# Patient Record
Sex: Male | Born: 1965
Health system: Southern US, Community
[De-identification: ages and names within clinical notes are randomized; demographics above are authoritative.]

## PROBLEM LIST (undated history)

## (undated) DIAGNOSIS — I10 Essential (primary) hypertension: Secondary | ICD-10-CM

## (undated) DIAGNOSIS — M109 Gout, unspecified: Secondary | ICD-10-CM

---

## 1998-07-06 ENCOUNTER — Emergency Department (HOSPITAL_COMMUNITY): Admission: EM | Admit: 1998-07-06 | Discharge: 1998-07-06 | Payer: Self-pay | Admitting: Emergency Medicine

## 1998-09-29 ENCOUNTER — Emergency Department (HOSPITAL_COMMUNITY): Admission: EM | Admit: 1998-09-29 | Discharge: 1998-09-29 | Payer: Self-pay | Admitting: *Deleted

## 1998-10-16 ENCOUNTER — Emergency Department (HOSPITAL_COMMUNITY): Admission: EM | Admit: 1998-10-16 | Discharge: 1998-10-16 | Payer: Self-pay | Admitting: Emergency Medicine

## 2005-02-03 ENCOUNTER — Emergency Department (HOSPITAL_COMMUNITY): Admission: EM | Admit: 2005-02-03 | Discharge: 2005-02-03 | Payer: Self-pay | Admitting: Emergency Medicine

## 2005-02-03 ENCOUNTER — Inpatient Hospital Stay (HOSPITAL_COMMUNITY): Admission: RE | Admit: 2005-02-03 | Discharge: 2005-02-07 | Payer: Self-pay | Admitting: Psychiatry

## 2005-02-03 ENCOUNTER — Ambulatory Visit: Payer: Self-pay | Admitting: Psychiatry

## 2005-02-10 ENCOUNTER — Other Ambulatory Visit (HOSPITAL_COMMUNITY): Admission: RE | Admit: 2005-02-10 | Discharge: 2005-03-05 | Payer: Self-pay | Admitting: Psychiatry

## 2006-11-16 ENCOUNTER — Emergency Department (HOSPITAL_COMMUNITY): Admission: EM | Admit: 2006-11-16 | Discharge: 2006-11-16 | Payer: Self-pay | Admitting: Family Medicine

## 2007-03-16 ENCOUNTER — Emergency Department (HOSPITAL_COMMUNITY): Admission: EM | Admit: 2007-03-16 | Discharge: 2007-03-16 | Payer: Self-pay | Admitting: Emergency Medicine

## 2007-06-15 ENCOUNTER — Emergency Department (HOSPITAL_COMMUNITY): Admission: EM | Admit: 2007-06-15 | Discharge: 2007-06-15 | Payer: Self-pay | Admitting: Emergency Medicine

## 2007-12-03 ENCOUNTER — Emergency Department (HOSPITAL_COMMUNITY): Admission: EM | Admit: 2007-12-03 | Discharge: 2007-12-03 | Payer: Self-pay | Admitting: Emergency Medicine

## 2010-04-12 ENCOUNTER — Emergency Department (HOSPITAL_COMMUNITY): Admission: EM | Admit: 2010-04-12 | Discharge: 2010-04-12 | Payer: Self-pay | Admitting: Emergency Medicine

## 2010-10-10 LAB — POCT CARDIAC MARKERS
CKMB, poc: <1 ng/mL — ABNORMAL LOW (ref 1.0–8.0)
Troponin i, poc: <0.05 ng/mL (ref 0.00–0.09)

## 2010-10-10 LAB — BASIC METABOLIC PANEL
BUN: 8 mg/dL (ref 6–23)
Chloride: 107 mEq/L (ref 96–112)
GFR calc Af Amer: >60 mL/min (ref >60)
GFR calc non Af Amer: >60 mL/min (ref >60)
Potassium: 3.8 mEq/L (ref 3.5–5.1)
Sodium: 137 mEq/L (ref 135–145)

## 2010-10-10 LAB — CBC
HCT: 44.2 % (ref 39.0–52.0)
RBC: 4.96 MIL/uL (ref 4.22–5.81)
RDW: 13.7 % (ref 11.5–15.5)
WBC: 4 10*3/uL (ref 4.0–10.5)

## 2010-10-10 LAB — DIFFERENTIAL
Basophils Absolute: 0 10*3/uL (ref 0.0–0.1)
Lymphocytes Relative: 40 % (ref 12–46)
Lymphs Abs: 1.6 10*3/uL (ref 0.7–4.0)
Monocytes Absolute: 0.4 10*3/uL (ref 0.1–1.0)
Neutro Abs: 1.7 10*3/uL (ref 1.7–7.7)

## 2010-10-10 LAB — GLUCOSE, CAPILLARY: Glucose-Capillary: 149 mg/dL — ABNORMAL HIGH (ref 70–99)

## 2010-11-27 ENCOUNTER — Inpatient Hospital Stay (INDEPENDENT_AMBULATORY_CARE_PROVIDER_SITE_OTHER)
Admission: RE | Admit: 2010-11-27 | Discharge: 2010-11-27 | Disposition: A | Discharge status: Home / Self Care | Payer: Self-pay | Source: Ambulatory Visit | Attending: Family Medicine | Admitting: Family Medicine

## 2010-11-27 DIAGNOSIS — J069 Acute upper respiratory infection, unspecified: Secondary | ICD-10-CM

## 2010-12-13 NOTE — Discharge Summary (Signed)
NAME:  Bill Ferrell, Bill Ferrell NO.:  [id number]   MEDICAL RECORD NO.:  [id number]          PATIENT TYPE:  IPS   LOCATION:  0303                          FACILITY:  [facility]   PHYSICIAN:  Jeanice Lim, M.D. DATE OF BIRTH:  08-Jul-1966   DATE OF ADMISSION:  02/03/2005  DATE OF DISCHARGE:  02/07/2005                                 DISCHARGE SUMMARY   IDENTIFYING DATA:  This is a 44 year old African-American married male  admitted at the presenting emergency department to get off drugs and  alcohol.  Escalating use for the last two years.  He has been drinking up to  20 beers a day, staying intoxicated all the time.  Using cocaine for the  past eight years, escalating use for the past two years, using 2-3 times per  week.  Limited support system.  Initially had a binge pattern and then began  using daily.   PAST PSYCHIATRIC HISTORY:  This is the first inpatient psychiatric  treatment, first inpatient detoxification.  He has been seen in [facility] as an  outpatient.  Denied a history of mood swings or other mood symptoms.  The  longest period of sobriety was approximately 30 days and this has not been  recently.   MEDICATIONS:  Hydrochlorothiazide.   NEUROLOGIC AND PHYSICAL EXAMINATION:  Essentially within normal limits.   ROUTINE ADMISSION LABS:  Within normal limits.   MENTAL STATUS EXAM:  Fully alert, pleasant, cooperative.  Felt that he was  somewhat useless, hopeless, helpless and apparently recognized the severity  of his addiction.  No suicidal thoughts, however.  No psychotic symptoms.  Cognition was intact.  Judgment and insight were somewhat impaired with poor  impulse control.   ADMITTING DIAGNOSES:  Axis I.  1.  Rule out substance induced mood disorder.  1.  Alcohol dependence.  2.  Cocaine dependence.  Axis II.  Deferred.  Axis III.  Hypertension.  Axis IV.  Moderate, marital friction, occupational issues.  Axis V.  30/60.   HOSPITAL COURSE:  The patient was  admitted, ordered routine p.r.n.  medications, placed on safety checks, placed on detoxification protocol for  safe withdrawal with close medical monitoring.  The patient was detoxified  without complications.  Support system mobilized, family contacted and were  involved in aftercare planning including substance abuse treatment and  relapse prevention plan.  The patient was discharged in improved condition  with no acute withdrawal symptoms and no dangerous ideations, mood stable.  Improved insight.  Given medication education and discharged on Symmetrel  100 mg twice a day, trazodone 50 mg 1-1/2 at 8 p.m. and Zoloft 50 mg in the  morning.  Follow up with [facility] on 07/17 at 10:30 and possibly [facility] for an  assessment.   DISCHARGE DIAGNOSES:  Axis I.  1.  Rule out substance induced mood disorder.  1.  Alcohol dependence.  2.  Cocaine dependence.  Axis II.  Deferred.  Axis III.  Hypertension.  Axis IV.  Moderate, marital friction, occupational issues.  Axis V.  55      James E.  Kathrynn Running, M.D.  Electronically Signed     JEM/MEDQ  D:  03/20/2005  T:  03/20/2005  Job:  [id number]
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
## 2013-11-27 ENCOUNTER — Emergency Department (INDEPENDENT_AMBULATORY_CARE_PROVIDER_SITE_OTHER)
Admission: EM | Admit: 2013-11-27 | Discharge: 2013-11-27 | Disposition: A | Discharge status: Home / Self Care | Payer: Self-pay | Source: Home / Self Care | Attending: Family Medicine | Admitting: Family Medicine

## 2013-11-27 ENCOUNTER — Encounter (HOSPITAL_COMMUNITY): Payer: Self-pay | Admitting: Emergency Medicine

## 2013-11-27 DIAGNOSIS — I10 Essential (primary) hypertension: Secondary | ICD-10-CM

## 2013-11-27 DIAGNOSIS — M109 Gout, unspecified: Secondary | ICD-10-CM

## 2013-11-27 HISTORY — DX: Gout, unspecified: M10.9

## 2013-11-27 HISTORY — DX: Essential (primary) hypertension: I10

## 2013-11-27 MED ORDER — COLCHICINE 0.6 MG PO TABS: ORAL_TABLET | ORAL | Status: DC

## 2013-11-27 NOTE — ED Provider Notes (Signed)
CSN: [id number]     Arrival date & time 11/27/13  1333 History   First MD Initiated Contact with Patient 11/27/13 1418     Chief Complaint  Patient presents with  . Joint Swelling   (Consider location/radiation/quality/duration/timing/severity/associated sxs/prior Treatment) HPI Comments: States he has known hx of gout for which he is not currently taking medication. States he has been experiencing a gout flare at his left ankle over past 6 days. Some relief of discomfort with the use of aspirin. No recent injury. No fever. Mild pain, redness and swelling at left medial ankle.   The history is provided by the patient.    Past Medical History  Diagnosis Date  . Hypertension   . Gout    History reviewed. No pertinent past surgical history. History reviewed. No pertinent family history. History  Substance Use Topics  . Smoking status: Current Every Day Smoker  . Smokeless tobacco: Not on file  . Alcohol Use: Yes    Review of Systems  All other systems reviewed and are negative.   Allergies  Review of patient's allergies indicates no known allergies.  Home Medications   Prior to Admission medications   Not on File   BP 153/103  Pulse 66  Temp(Src) 98.5 F (36.9 C) (Oral)  Resp 16  SpO2 98% Physical Exam  Nursing note and vitals reviewed. Constitutional: He is oriented to person, place, and time. He appears well-developed and well-nourished. No distress.  HENT:  Head: Normocephalic and atraumatic.  Eyes: Conjunctivae are normal.  Cardiovascular: Normal rate.   Pulmonary/Chest: Effort normal.  Musculoskeletal: Normal range of motion.       Feet:  Outlined area is area of tenderness with mild erythema and STS. No induration or lymphangitis  Neurological: He is alert and oriented to person, place, and time.  Skin: Skin is warm and dry. No rash noted. There is erythema.  Psychiatric: He has a normal mood and affect. His behavior is normal.    ED Course  Procedures  (including critical care time) Labs Review Labs Reviewed - No data to display  Imaging Review No results found.   MDM   1. HTN (hypertension)   2. Gout attack    Advised patient that I strongly encourage him to seek out a primary care doctor for management of his HTN to try to avoid end organ damage. Provided information regarding [facility] and [facility]. Colchicine as prescribed for gout flare.    Jess BartersJennifer Lee GeorgetownPresson, GeorgiaPA 11/27/13 1450

## 2013-11-27 NOTE — Discharge Instructions (Signed)
DASH Diet The DASH diet stands for "Dietary Approaches to Stop Hypertension." It is a healthy eating plan that has been shown to reduce high blood pressure (hypertension) in as little as 14 days, while also possibly providing other significant health benefits. These other health benefits include reducing the risk of breast cancer after menopause and reducing the risk of type 2 diabetes, heart disease, colon cancer, and stroke. Health benefits also include weight loss and slowing kidney failure in patients with chronic kidney disease.  DIET GUIDELINES  Limit salt (sodium). Your diet should contain less than 1500 mg of sodium daily.  Limit refined or processed carbohydrates. Your diet should include mostly whole grains. Desserts and added sugars should be used sparingly.  Include small amounts of heart-healthy fats. These types of fats include nuts, oils, and tub margarine. Limit saturated and trans fats. These fats have been shown to be harmful in the body. CHOOSING FOODS  The following food groups are based on a 2000 calorie diet. See your Registered Dietitian for individual calorie needs. Grains and Grain Products (6 to 8 servings daily)  Eat More Often: Whole-wheat bread, brown rice, whole-grain or wheat pasta, quinoa, popcorn without added fat or salt (air popped).  Eat Less Often: White bread, white pasta, white rice, cornbread. Vegetables (4 to 5 servings daily)  Eat More Often: Fresh, frozen, and canned vegetables. Vegetables may be raw, steamed, roasted, or grilled with a minimal amount of fat.  Eat Less Often/Avoid: Creamed or fried vegetables. Vegetables in a cheese sauce. Fruit (4 to 5 servings daily)  Eat More Often: All fresh, canned (in natural juice), or frozen fruits. Dried fruits without added sugar. One hundred percent fruit juice ( cup [237 mL] daily).  Eat Less Often: Dried fruits with added sugar. Canned fruit in light or heavy syrup. [employer], Fish, and Poultry (2  servings or less daily. One serving is 3 to 4 oz [85-114 g]).  Eat More Often: Ninety percent or leaner ground beef, tenderloin, sirloin. Round cuts of beef, chicken breast, [place] breast. All fish. Grill, bake, or broil your meat. Nothing should be fried.  Eat Less Often/Avoid: Fatty cuts of meat, [place], or chicken leg, thigh, or wing. Fried cuts of meat or fish. Dairy (2 to 3 servings)  Eat More Often: Low-fat or fat-free milk, low-fat plain or light yogurt, reduced-fat or part-skim cheese.  Eat Less Often/Avoid: Milk (whole, 2%).Whole milk yogurt. Full-fat cheeses. Nuts, Seeds, and Legumes (4 to 5 servings per week)  Eat More Often: All without added salt.  Eat Less Often/Avoid: Salted nuts and seeds, canned beans with added salt. Fats and Sweets (limited)  Eat More Often: Vegetable oils, tub margarines without trans fats, sugar-free gelatin. Mayonnaise and salad dressings.  Eat Less Often/Avoid: Coconut oils, palm oils, butter, stick margarine, cream, half and half, cookies, candy, pie. FOR MORE INFORMATION The Dash Diet Eating Plan: www.dashdiet.org Document Released: 07/03/2011 Document Revised: 10/06/2011 Document Reviewed: 07/03/2011 [facility] Patient Information 2014 [place], [place].  Hypertension As your heart beats, it forces blood through your arteries. This force is your blood pressure. If the pressure is too high, it is called hypertension (HTN) or high blood pressure. HTN is dangerous because you may have it and not know it. High blood pressure may mean that your heart has to work harder to pump blood. Your arteries may be narrow or stiff. The extra work puts you at risk for heart disease, stroke, and other problems.  Blood pressure consists of two numbers,  a higher number over a lower, 110/72, for example. It is stated as "110 over 72." The ideal is below 120 for the top number (systolic) and under 80 for the bottom (diastolic). Write down your blood pressure today. You  should pay close attention to your blood pressure if you have certain conditions such as:  Heart failure.  Prior heart attack.  Diabetes  Chronic kidney disease.  Prior stroke.  Multiple risk factors for heart disease. To see if you have HTN, your blood pressure should be measured while you are seated with your arm held at the level of the heart. It should be measured at least twice. A one-time elevated blood pressure reading (especially in the Emergency Department) does not mean that you need treatment. There may be conditions in which the blood pressure is different between your right and left arms. It is important to see your caregiver soon for a recheck. Most people have essential hypertension which means that there is not a specific cause. This type of high blood pressure may be lowered by changing lifestyle factors such as:  Stress.  Smoking.  Lack of exercise.  Excessive weight.  Drug/tobacco/alcohol use.  Eating less salt. Most people do not have symptoms from high blood pressure until it has caused damage to the body. Effective treatment can often prevent, delay or reduce that damage. TREATMENT  When a cause has been identified, treatment for high blood pressure is directed at the cause. There are a large number of medications to treat HTN. These fall into several categories, and your caregiver will help you select the medicines that are best for you. Medications may have side effects. You should review side effects with your caregiver. If your blood pressure stays high after you have made lifestyle changes or started on medicines,   Your medication(s) may need to be changed.  Other problems may need to be addressed.  Be certain you understand your prescriptions, and know how and when to take your medicine.  Be sure to follow up with your caregiver within the time frame advised (usually within two weeks) to have your blood pressure rechecked and to review your  medications.  If you are taking more than one medicine to lower your blood pressure, make sure you know how and at what times they should be taken. Taking two medicines at the same time can result in blood pressure that is too low. SEEK IMMEDIATE MEDICAL CARE IF:  You develop a severe headache, blurred or changing vision, or confusion.  You have unusual weakness or numbness, or a faint feeling.  You have severe chest or abdominal pain, vomiting, or breathing problems. MAKE SURE YOU:   Understand these instructions.  Will watch your condition.  Will get help right away if you are not doing well or get worse. Document Released: 07/14/2005 Document Revised: 10/06/2011 Document Reviewed: 03/03/2008 [facility] Patient Information 2014 [place], [place].  Gout Gout is an inflammatory arthritis caused by a buildup of uric acid crystals in the joints. Uric acid is a chemical that is normally present in the blood. When the level of uric acid in the blood is too high it can form crystals that deposit in your joints and tissues. This causes joint redness, soreness, and swelling (inflammation). Repeat attacks are common. Over time, uric acid crystals can form into masses (tophi) near a joint, destroying bone and causing disfigurement. Gout is treatable and often preventable. CAUSES  The disease begins with elevated levels of uric acid in the  blood. Uric acid is produced by your body when it breaks down a naturally found substance called purines. Certain foods you eat, such as meats and fish, contain high amounts of purines. Causes of an elevated uric acid level include:  Being passed down from parent to child (heredity).  Diseases that cause increased uric acid production (such as obesity, psoriasis, and certain cancers).  Excessive alcohol use.  Diet, especially diets rich in meat and seafood.  Medicines, including certain cancer-fighting medicines (chemotherapy), water pills (diuretics), and  aspirin.  Chronic kidney disease. The kidneys are no longer able to remove uric acid well.  Problems with metabolism. Conditions strongly associated with gout include:  Obesity.  High blood pressure.  High cholesterol.  Diabetes. Not everyone with elevated uric acid levels gets gout. It is not understood why some people get gout and others do not. Surgery, joint injury, and eating too much of certain foods are some of the factors that can lead to gout attacks. SYMPTOMS   An attack of gout comes on quickly. It causes intense pain with redness, swelling, and warmth in a joint.  Fever can occur.  Often, only one joint is involved. Certain joints are more commonly involved:  Base of the big toe.  Knee.  Ankle.  Wrist.  Finger. Without treatment, an attack usually goes away in a few days to weeks. Between attacks, you usually will not have symptoms, which is different from many other forms of arthritis. DIAGNOSIS  Your caregiver will suspect gout based on your symptoms and exam. In some cases, tests may be recommended. The tests may include:  Blood tests.  Urine tests.  X-rays.  Joint fluid exam. This exam requires a needle to remove fluid from the joint (arthrocentesis). Using a microscope, gout is confirmed when uric acid crystals are seen in the joint fluid. TREATMENT  There are two phases to gout treatment: treating the sudden onset (acute) attack and preventing attacks (prophylaxis).  Treatment of an Acute Attack.  Medicines are used. These include anti-inflammatory medicines or steroid medicines.  An injection of steroid medicine into the affected joint is sometimes necessary.  The painful joint is rested. Movement can worsen the arthritis.  You may use warm or cold treatments on painful joints, depending which works best for you.  Treatment to Prevent Attacks.  If you suffer from frequent gout attacks, your caregiver may advise preventive medicine. These  medicines are started after the acute attack subsides. These medicines either help your kidneys eliminate uric acid from your body or decrease your uric acid production. You may need to stay on these medicines for a very long time.  The early phase of treatment with preventive medicine can be associated with an increase in acute gout attacks. For this reason, during the first few months of treatment, your caregiver may also advise you to take medicines usually used for acute gout treatment. Be sure you understand your caregiver's directions. Your caregiver may make several adjustments to your medicine dose before these medicines are effective.  Discuss dietary treatment with your caregiver or dietitian. Alcohol and drinks high in sugar and fructose and foods such as meat, poultry, and seafood can increase uric acid levels. Your caregiver or dietician can advise you on drinks and foods that should be limited. HOME CARE INSTRUCTIONS   Do not take aspirin to relieve pain. This raises uric acid levels.  Only take over-the-counter or prescription medicines for pain, discomfort, or fever as directed by your caregiver.  Rest  the joint as much as possible. When in bed, keep sheets and blankets off painful areas.  Keep the affected joint raised (elevated).  Apply warm or cold treatments to painful joints. Use of warm or cold treatments depends on which works best for you.  Use crutches if the painful joint is in your leg.  Drink enough fluids to keep your urine clear or pale yellow. This helps your body get rid of uric acid. Limit alcohol, sugary drinks, and fructose drinks.  Follow your dietary instructions. Pay careful attention to the amount of protein you eat. Your daily diet should emphasize fruits, vegetables, whole grains, and fat-free or low-fat milk products. Discuss the use of coffee, vitamin C, and cherries with your caregiver or dietician. These may be helpful in lowering uric acid  levels.  Maintain a healthy body weight. SEEK MEDICAL CARE IF:   You develop diarrhea, vomiting, or any side effects from medicines.  You do not feel better in 24 hours, or you are getting worse. SEEK IMMEDIATE MEDICAL CARE IF:   Your joint becomes suddenly more tender, and you have chills or a fever. MAKE SURE YOU:   Understand these instructions.  Will watch your condition.  Will get help right away if you are not doing well or get worse. Document Released: 07/11/2000 Document Revised: 11/08/2012 Document Reviewed: 02/25/2012 [facility] Patient Information 2014 [place], [place].

## 2013-11-27 NOTE — ED Notes (Signed)
Pt  Has  Pain and  Swelling  Of  The  Left  Ankle       -    He  denys  Any injury            He  Reports  He  Woke up with  The  Pain   About 3-4  Days  Ago    He  Reports a  History of  Gout     He has high blood  Pressure but takes  No  meds

## 2013-11-30 NOTE — ED Provider Notes (Signed)
Medical screening examination/treatment/procedure(s) were performed by a resident physician or non-physician practitioner and as the supervising physician I was immediately available for consultation/collaboration.  Clementeen GrahamEvan Corey, MD   Rodolph BongEvan S Corey, MD 11/30/13 [phone]

## 2014-10-06 ENCOUNTER — Encounter (HOSPITAL_COMMUNITY): Payer: Self-pay | Admitting: Emergency Medicine

## 2014-10-06 ENCOUNTER — Emergency Department (HOSPITAL_COMMUNITY)
Admission: EM | Admit: 2014-10-06 | Discharge: 2014-10-08 | Disposition: A | Discharge status: Other Acute Inpatient Hospital | Payer: Self-pay | Attending: Emergency Medicine | Admitting: Emergency Medicine

## 2014-10-06 ENCOUNTER — Emergency Department (HOSPITAL_COMMUNITY): Payer: Self-pay

## 2014-10-06 DIAGNOSIS — Z720 Tobacco use: Secondary | ICD-10-CM | POA: Insufficient documentation

## 2014-10-06 DIAGNOSIS — Y904 Blood alcohol level of 80-99 mg/100 ml: Secondary | ICD-10-CM | POA: Insufficient documentation

## 2014-10-06 DIAGNOSIS — F10920 Alcohol use, unspecified with intoxication, uncomplicated: Secondary | ICD-10-CM

## 2014-10-06 DIAGNOSIS — F10230 Alcohol dependence with withdrawal, uncomplicated: Secondary | ICD-10-CM | POA: Diagnosis present

## 2014-10-06 DIAGNOSIS — F1410 Cocaine abuse, uncomplicated: Secondary | ICD-10-CM | POA: Diagnosis present

## 2014-10-06 DIAGNOSIS — F329 Major depressive disorder, single episode, unspecified: Secondary | ICD-10-CM | POA: Insufficient documentation

## 2014-10-06 DIAGNOSIS — R0789 Other chest pain: Secondary | ICD-10-CM | POA: Insufficient documentation

## 2014-10-06 DIAGNOSIS — R1011 Right upper quadrant pain: Secondary | ICD-10-CM | POA: Insufficient documentation

## 2014-10-06 DIAGNOSIS — F1994 Other psychoactive substance use, unspecified with psychoactive substance-induced mood disorder: Secondary | ICD-10-CM | POA: Diagnosis present

## 2014-10-06 DIAGNOSIS — I10 Essential (primary) hypertension: Secondary | ICD-10-CM | POA: Insufficient documentation

## 2014-10-06 DIAGNOSIS — F1490 Cocaine use, unspecified, uncomplicated: Secondary | ICD-10-CM

## 2014-10-06 DIAGNOSIS — M109 Gout, unspecified: Secondary | ICD-10-CM | POA: Insufficient documentation

## 2014-10-06 DIAGNOSIS — F1414 Cocaine abuse with cocaine-induced mood disorder: Secondary | ICD-10-CM | POA: Insufficient documentation

## 2014-10-06 LAB — COMPREHENSIVE METABOLIC PANEL
ALK PHOS: 75 U/L (ref 39–117)
ALT: 34 U/L (ref 0–53)
ANION GAP: 10 (ref 5–15)
AST: 26 U/L (ref 0–37)
Albumin: 4.4 g/dL (ref 3.5–5.2)
BUN: 14 mg/dL (ref 6–23)
CALCIUM: 9.1 mg/dL (ref 8.4–10.5)
CO2: 25 mmol/L (ref 19–32)
Chloride: 107 mmol/L (ref 96–112)
Creatinine, Ser: 1.08 mg/dL (ref 0.50–1.35)
GFR, EST NON AFRICAN AMERICAN: 78 mL/min — AB (ref >90)
GLUCOSE: 89 mg/dL (ref 70–99)
POTASSIUM: 4 mmol/L (ref 3.5–5.1)
Sodium: 142 mmol/L (ref 135–145)
TOTAL PROTEIN: 8.4 g/dL — AB (ref 6.0–8.3)
Total Bilirubin: 2.2 mg/dL — ABNORMAL HIGH (ref 0.3–1.2)

## 2014-10-06 LAB — CBC
HCT: 49 % (ref 39.0–52.0)
Hemoglobin: 16.6 g/dL (ref 13.0–17.0)
MCH: 30.5 pg (ref 26.0–34.0)
MCHC: 33.9 g/dL (ref 30.0–36.0)
MCV: 89.9 fL (ref 78.0–100.0)
PLATELETS: 330 10*3/uL (ref 150–400)
RBC: 5.45 MIL/uL (ref 4.22–5.81)
RDW: 14.2 % (ref 11.5–15.5)
WBC: 6.3 10*3/uL (ref 4.0–10.5)

## 2014-10-06 LAB — ETHANOL: ALCOHOL ETHYL (B): 94 mg/dL — AB (ref 0–9)

## 2014-10-06 LAB — SALICYLATE LEVEL: Salicylate Lvl: <4 mg/dL (ref 2.8–20.0)

## 2014-10-06 LAB — ACETAMINOPHEN LEVEL

## 2014-10-06 MED ORDER — LORAZEPAM 1 MG PO TABS
0.0000 mg | ORAL_TABLET | Freq: Four times a day (QID) | ORAL | Status: DC
  Administered 2014-10-08: 1 mg via ORAL
  Filled 2014-10-06: qty 1

## 2014-10-06 MED ORDER — LORAZEPAM 2 MG/ML IJ SOLN: 0.0000 mg | Freq: Four times a day (QID) | INTRAMUSCULAR | Status: DC

## 2014-10-06 MED ORDER — THIAMINE HCL 100 MG/ML IJ SOLN: 100.0000 mg | Freq: Every day | INTRAMUSCULAR | Status: DC

## 2014-10-06 MED ORDER — LORAZEPAM 1 MG PO TABS
0.0000 mg | ORAL_TABLET | Freq: Two times a day (BID) | ORAL | Status: DC
Start: 2014-10-06 — End: 2014-10-08
  Administered 2014-10-06: 1 mg via ORAL
  Filled 2014-10-06: qty 1

## 2014-10-06 MED ORDER — LORAZEPAM 2 MG/ML IJ SOLN: 0.0000 mg | Freq: Two times a day (BID) | INTRAMUSCULAR | Status: DC

## 2014-10-06 MED ORDER — VITAMIN B-1 100 MG PO TABS
100.0000 mg | ORAL_TABLET | Freq: Every day | ORAL | Status: DC
  Administered 2014-10-07: 100 mg via ORAL
  Filled 2014-10-06: qty 1

## 2014-10-06 NOTE — ED Provider Notes (Signed)
CSN: [id number]     Arrival date & time 10/06/14  1807 History   First MD Initiated Contact with Patient 10/06/14 2234     Chief Complaint  Patient presents with  . detox      (Consider location/radiation/quality/duration/timing/severity/associated sxs/prior Treatment) HPI Comments: Bill Ferrell is a 49 y.o. male with a PMHx of HTN, gout, depression (untreated), and polysubstance abuse, who presents to the ED with complaints of suicidal ideations with a plan of overdosing on sleeping pills, and requesting detox from alcohol and cocaine. He drank an unknown amount today, last sip was at noon, and he snorted an unknown amount of cocaine earlier today. States he has a history of depression but is not taking any medications. He denies any history of DTs or seizures with withdrawal from alcohol. He has been admitted to the hospital once for alcoholism, but has had no prior suicide attempts. He smokes 1/2ppd. Denies any homicidal ideations or auditory/visual hallucinations. Additionally he reports that all day he has had right sided lower chest/right upper abdomen pressure-like/dull pain which he rates at a 3/10, constant, nonradiating, worse with movement, with no medications tried PTA. States it was gradual onset and very mild all day. Denies any fevers, chills, jaw/neck/back pain, shortness of breath, cough, hemoptysis, leg swelling, wheezing, nausea, vomiting, diarrhea, melena, hematochezia, dysuria, hematuria, back or flank pain, arthralgias, myalgias, numbness, tingling, or weakness. No recent travel/immobilization/surgeries. No hx of blood clots.   Patient is a 49 y.o. male presenting with mental health disorder. The history is provided by the patient. No language interpreter was used.  Mental Health Problem Presenting symptoms: depression and suicidal thoughts   Presenting symptoms: no hallucinations, no homicidal ideas, no suicidal threats and no suicide attempt   Patient accompanied by:   Spouse Onset quality:  Unable to specify Timing:  Constant Progression:  Unchanged Chronicity:  Chronic Context: alcohol use and drug abuse   Treatment compliance:  Untreated Relieved by:  None tried Worsened by:  Alcohol and drugs Ineffective treatments:  None tried Associated symptoms: abdominal pain (right upper quadrant) and chest pain (R lower side/right upper abd pain)   Associated symptoms: no headaches   Abdominal pain:    Location:  RUQ   Quality:  Pressure   Severity:  Mild   Onset quality:  Gradual   Duration:  1 day   Timing:  Constant   Progression:  Unchanged   Chronicity:  New Chest pain:    Quality:  Dull   Severity:  Mild   Onset quality:  Gradual   Duration:  1 day   Timing:  Constant   Progression:  Unchanged   Chronicity:  New Risk factors: hx of mental illness   Risk factors: no hx of suicide attempts     Past Medical History  Diagnosis Date  . Hypertension   . Gout    History reviewed. No pertinent past surgical history. No family history on file. History  Substance Use Topics  . Smoking status: Current Every Day Smoker  . Smokeless tobacco: Not on file  . Alcohol Use: Yes    Review of Systems  Constitutional: Negative for fever and chills.  Respiratory: Negative for cough, shortness of breath and wheezing.   Cardiovascular: Positive for chest pain (R lower side/right upper abd pain). Negative for leg swelling.  Gastrointestinal: Positive for abdominal pain (right upper quadrant). Negative for nausea, vomiting, diarrhea and constipation.  Genitourinary: Negative for dysuria and hematuria.  Musculoskeletal: Negative for myalgias, back pain,  arthralgias and neck pain.  Skin: Negative for color change.  Allergic/Immunologic: Negative for immunocompromised state.  Neurological: Negative for weakness, numbness and headaches.  Psychiatric/Behavioral: Positive for suicidal ideas. Negative for homicidal ideas, hallucinations and confusion.   10  Systems reviewed and are negative for acute change except as noted in the HPI.    Allergies  Review of patient's allergies indicates no known allergies.  Home Medications   Prior to Admission medications   Medication Sig Start Date End Date Taking? Authorizing Provider  colchicine 0.6 MG tablet Take two tablets at once, then one tablet one hour later 11/27/13   Jess Barters H Presson, PA   BP 141/86 mmHg  Pulse 88  Temp(Src) 98.7 F (37.1 C) (Oral)  Resp 16  SpO2 97% Physical Exam  Constitutional: He is oriented to person, place, and time. Vital signs are normal. He appears well-developed and well-nourished.  Non-toxic appearance. No distress.  Afebrile, nontoxic, NAD, intoxicated but responds to questioning  HENT:  Head: Normocephalic and atraumatic.  Mouth/Throat: Oropharynx is clear and moist and mucous membranes are normal.  Eyes: Conjunctivae and EOM are normal. Pupils are equal, round, and reactive to light. Right eye exhibits no discharge. Left eye exhibits no discharge. Scleral icterus is present.  Mild scleral icterus, PERRL, EOMI, no nystagmus  Neck: Normal range of motion. Neck supple. No JVD present.  No JVD  Cardiovascular: Normal rate, regular rhythm, normal heart sounds and intact distal pulses.  Exam reveals no gallop and no friction rub.   No murmur heard. RRR, nl s1/s2, no m/r/g, distal pulses intact, no pedal edema   Pulmonary/Chest: Effort normal and breath sounds normal. No respiratory distress. He has no decreased breath sounds. He has no wheezes. He has no rhonchi. He has no rales. He exhibits tenderness. He exhibits no crepitus, no deformity and no retraction.    CTAB in all lung fields, no w/r/r, no hypoxia or increased WOB, speaking in full sentences, SpO2 97% on RA  Mild R lower chest TTP, no crepitus or deformities  Abdominal: Soft. Normal appearance and bowel sounds are normal. He exhibits no distension. There is tenderness in the right upper quadrant.  There is positive Murphy's sign. There is no rigidity, no rebound, no guarding, no CVA tenderness and no tenderness at McBurney's point.    Soft, ND, +BS throughout, with RUQ TTP, no r/g/r, +murphy's, neg mcburney's, no CVA TTP   Musculoskeletal: Normal range of motion.  MAE x4 Strength and sensation grossly intact Distal pulses intact No pedal edema with neg homan's sign bilaterally Gait fairly steady and nonataxic  Neurological: He is alert and oriented to person, place, and time. He has normal strength. No sensory deficit. Gait normal.  No visible tremors  Skin: Skin is warm, dry and intact. No rash noted.  Psychiatric: He is not actively hallucinating. He exhibits a depressed mood. He expresses suicidal ideation. He expresses no homicidal ideation. He expresses suicidal plans. He expresses no homicidal plans.  Depressed appearing, somewhat tearful, with +SI with plan for OD. Denies AVH/HI.  Nursing note and vitals reviewed.   ED Course  Procedures (including critical care time) Labs Review Labs Reviewed  ACETAMINOPHEN LEVEL - Abnormal; Notable for the following:    Acetaminophen (Tylenol), Serum <10.0 (*)    All other components within normal limits  COMPREHENSIVE METABOLIC PANEL - Abnormal; Notable for the following:    Total Protein 8.4 (*)    Total Bilirubin 2.2 (*)    GFR calc non  Af Amer 78 (*)    All other components within normal limits  ETHANOL - Abnormal; Notable for the following:    Alcohol, Ethyl (B) 94 (*)    All other components within normal limits  URINE RAPID DRUG SCREEN (HOSP PERFORMED) - Abnormal; Notable for the following:    Cocaine POSITIVE (*)    All other components within normal limits  CBC  SALICYLATE LEVEL  HEPATITIS PANEL, ACUTE  I-STAT TROPOININ, ED    Imaging Review Dg Chest 2 View  10/07/2014   CLINICAL DATA:  Acute onset of right-sided chest pain under the right breast. Initial encounter.  EXAM: CHEST  2 VIEW  COMPARISON:  Chest  radiograph performed 04/12/2010  FINDINGS: The lungs are well-aerated and clear. There is no evidence of focal opacification, pleural effusion or pneumothorax.  The heart is normal in size; the mediastinal contour is within normal limits. No acute osseous abnormalities are seen.  IMPRESSION: No acute cardiopulmonary process seen.   Electronically Signed   By: Roanna Raider M.D.   On: 10/07/2014 00:09   US Abdomen Limited Ruq  10/07/2014   CLINICAL DATA:  RIGHT upper quadrant pain, history of hypertension and gout. Detoxification.  EXAM: US ABDOMEN LIMITED - RIGHT UPPER QUADRANT  COMPARISON:  None.  FINDINGS: Gallbladder:  No gallstones or wall thickening visualized. No sonographic Murphy sign noted.  Common bile duct:  Diameter: 4 mm  Liver:  No focal lesion identified. Within normal limits in parenchymal echogenicity. Hepatopetal portal vein.  IMPRESSION: Normal RIGHT upper quadrant ultrasound.   Electronically Signed   By: Awilda Metro   On: 10/07/2014 01:23     EKG Interpretation   Date/Time:  Friday October 06 2014 23:53:40 EST Ventricular Rate:  63 PR Interval:  192 QRS Duration: 94 QT Interval:  408 QTC Calculation: 417 R Axis:   62 Text Interpretation:  Normal sinus rhythm Normal ECG PVCs no longer  present Confirmed by MOLPUS  MD, Jonny Ruiz ([id number]) on 10/07/2014 12:05:05 AM      MDM   Final diagnoses:  RUQ abdominal pain  Alcohol intoxication, uncomplicated  Suicidal ideations  Cocaine use  Hyperbilirubinemia  Atypical chest pain    49 y.o. male here with SI with plan, and requesting detox from EtOH and cocaine. Also has R sided lower chest/upper abd pain. Reproducible with palpation of his RUQ, somewhat +murphy's sign. Will obtain trop, EKG, CXR, and RUQ ultrasound. Acetaminophen and salicylate levels WNL. EtOH level 94. CMP showing elevated bilirubin, unclear if this is chronic vs acute. CBC WNL. UDS pending. Once medically evaluated, will proceed with clearance for psych.  Will place on CIWA. Will hold off on giving any ASA/NTG/morphine since unclear etiology for RUQ pain vs truly chest pain, and it's reproducible, want to avoid interactions or worsening of medical status. Will hold off on giving pain meds until further evaluation performed. Doubt DVT/PE since no tachycardia or hypoxia, no RFs. Will reassess shortly.  12:38 AM CXR and EKG both unremarkable. Trop and hepatitis panel not yet drawn. UDS pending. Abd U/S pending. Will reassess shortly.   1:27 AM Trop neg. Hepatitis panel drawn, pending. UDS with cocaine. RUQ U/S negative. Pt medically cleared. Holding orders placed, TTS consulted.     Lettie Czarnecki Camprubi-Soms, PA-C 10/07/14 0127  Jerelyn Scott, MD 10/07/14 [phone]

## 2014-10-06 NOTE — ED Notes (Signed)
Pt requesting detox from ETOH and cocaine, last use today.

## 2014-10-07 DIAGNOSIS — F1010 Alcohol abuse, uncomplicated: Secondary | ICD-10-CM

## 2014-10-07 DIAGNOSIS — F1910 Other psychoactive substance abuse, uncomplicated: Secondary | ICD-10-CM

## 2014-10-07 DIAGNOSIS — R45851 Suicidal ideations: Secondary | ICD-10-CM

## 2014-10-07 LAB — HEPATITIS PANEL, ACUTE
HCV AB: NEGATIVE
HEP B C IGM: NONREACTIVE
Hep A IgM: NONREACTIVE
Hepatitis B Surface Ag: NEGATIVE

## 2014-10-07 LAB — RAPID URINE DRUG SCREEN, HOSP PERFORMED
Amphetamines: NOT DETECTED
BARBITURATES: NOT DETECTED
Benzodiazepines: NOT DETECTED
COCAINE: POSITIVE — AB
Opiates: NOT DETECTED
TETRAHYDROCANNABINOL: NOT DETECTED

## 2014-10-07 LAB — I-STAT TROPONIN, ED: TROPONIN I, POC: 0.01 ng/mL (ref 0.00–0.08)

## 2014-10-07 MED ORDER — ALUM & MAG HYDROXIDE-SIMETH 200-200-20 MG/5ML PO SUSP: 30.0000 mL | ORAL | Status: DC | PRN

## 2014-10-07 MED ORDER — NICOTINE 21 MG/24HR TD PT24: 21.0000 mg | MEDICATED_PATCH | Freq: Every day | TRANSDERMAL | Status: DC

## 2014-10-07 MED ORDER — ACETAMINOPHEN 325 MG PO TABS: 650.0000 mg | ORAL_TABLET | ORAL | Status: DC | PRN

## 2014-10-07 MED ORDER — ONDANSETRON HCL 4 MG PO TABS: 4.0000 mg | ORAL_TABLET | Freq: Three times a day (TID) | ORAL | Status: DC | PRN

## 2014-10-07 MED ORDER — IBUPROFEN 200 MG PO TABS: 600.0000 mg | ORAL_TABLET | Freq: Three times a day (TID) | ORAL | Status: DC | PRN

## 2014-10-07 MED ORDER — ZOLPIDEM TARTRATE 5 MG PO TABS
5.0000 mg | ORAL_TABLET | Freq: Every evening | ORAL | Status: DC | PRN
  Administered 2014-10-07: 5 mg via ORAL
  Filled 2014-10-07: qty 1

## 2014-10-07 NOTE — Progress Notes (Signed)
Attempted to secure placement with the following facilities:  [facility]- no beds [facility]- faxed referral  Maryelizabeth Rowanressa Dandrea Widdowson, MSW, LCSW, LCAS-A Evening Clinical Social Worker [phone]
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
## 2014-10-07 NOTE — ED Notes (Signed)
Patient presents calm and cooperative lying in bed watching TV; denies SI/HI/AVH. NAD

## 2014-10-07 NOTE — Progress Notes (Signed)
Tione with [facility] reports she was not able to review the referral and will pass it on to the evening shift and they will call back.   Maryelizabeth Rowanressa Glennis Borger, MSW, LCSW, LCAS-A Evening Clinical Social Worker [phone]

## 2014-10-07 NOTE — BH Assessment (Signed)
Assessment completed. Consulted Birdena JubileeIjeoma Nwaze, NP who agrees that pt meets inpatient criteria. [facility]. Antony MaduraKelly Humes, PA-C has been informed of the recommendation.

## 2014-10-07 NOTE — Consult Note (Addendum)
[facility] Face-to[facility]Face [facility]iat[facility] Consult   Reason for Consult:  Polysubstance abuse, suicidal Referring Physician:  EDP Patient Identification: Bill MakiJohn L Ferrell MRN:  [id number] Principal Diagnosis: <principal problem not specified> Diagnosis:  There are no active problems to display for this patient.   Total Time spent with patient: 30 minutes  Subjective:   Bill MakiJohn L Riker is a 49 y.o. male patient admitted with alcohol and drug abuse, suicidal ideation.Bill Ferrell.  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 He has long term history of alcohol and drug abuse.  He has been using cocaine and drinking a 40 oz beer every night and then going to a bar to drink more. He has become discouraged about his substance abuse and had thoughts that his wife may be better with him dead and collecting life insurance money. He has no psychotic symptoms but requests further treatment in a substance abuse treatment facility.  HPI Elements:   Location:  global. Quality:  severe. Severity:  severe. Timing:  several days. Duration:  years. Context:  argument with wife.  Past Medical History:  Past Medical History  Diagnosis Date  . Hypertension   . Gout    History reviewed. No pertinent past surgical history. Family History: No family history on file. Social History:  History  Alcohol Use  . Yes     History  Drug Use Not on file    History   Social History  . Marital Status: Married    Spouse Name: N/A  . Number of Children: N/A  . Years of Education: N/A   Social History Main Topics  . Smoking status: Current Every Day Smoker  . Smokeless tobacco: Not on file  . Alcohol Use: Yes  . Drug Use: Not on file  . Sexual Activity: Not on file   Other Topics Concern  . None   Social History Narrative   Additional Social History:    Pain Medications: Pt denies abuse.  Prescriptions: Pt denies abuse. Over the Counter: Pt denies abuse.  History of alcohol  / drug use?: Yes Negative Consequences of Use: Legal, Work / Programmer, multimediachool, Personal relationships, Financial Name of Substance 1: Alcohol  1 - Age of First Use: 14 1 - Amount (size/oz): "2-40oz" 1 - Frequency: daily  1 - Duration: 4 mos.  1 - Last Use / Amount: 10-06-14 BAL- 94 Name of Substance 2: Cocaine  2 - Age of First Use: 30 2 - Amount (size/oz): "1 gram" 2 - Frequency: every other day  2 - Duration: ongoing  2 - Last Use / Amount: 10-06-14                 Allergies:  No Known Allergies  Vitals: Blood pressure 146/89, pulse 78, temperature 98.7 F (37.1 C), temperature source Oral, resp. rate 18, SpO2 97 %.  Risk to Self: Suicidal Ideation: No-Not Currently/Within Last 6 Months Suicidal Intent: No Is patient at risk for suicide?: No Suicidal Plan?: No Access to Means: No What has been your use of drugs/alcohol within the last 12 months?: Daily alcohol use reported. How many times?: 1 Other Self Harm Risks: No other self harm risk identified at this time.  Triggers for Past Attempts: Unpredictable Intentional Self Injurious Behavior: None Risk to Others: Homicidal Ideation: No Thoughts of Harm to Others: No Current Homicidal Intent: No Current Homicidal Plan: No Access to Homicidal Means: No Identified Victim: NA History of harm to  others?: No Assessment of Violence: None Noted Violent Behavior Description: No violent behaviors observed at this time. Pt is calm and cooperative.  Does patient have access to weapons?: No Criminal Charges Pending?: No Does patient have a court date: No Prior Inpatient Therapy: Prior Inpatient Therapy: Yes Prior Therapy Dates: 2006 Prior Therapy Facilty/Provider(s): [facility]  Reason for Treatment: Substance Abuse  Prior Outpatient Therapy: Prior Outpatient Therapy: No  Current Facility-Administered Medications  Medication Dose Route Frequency Provider Last Rate Last Dose  . acetaminophen (TYLENOL) tablet 650 mg  650 mg Oral Q4H PRN  Mercedes Camprubi-Soms, PA-C      . alum & mag hydroxide-simeth (MAALOX/MYLANTA) 200-200-20 MG/5ML suspension 30 mL  30 mL Oral PRN Mercedes Camprubi-Soms, PA-C      . ibuprofen (ADVIL,MOTRIN) tablet 600 mg  600 mg Oral Q8H PRN Mercedes Camprubi-Soms, PA-C      . LORazepam (ATIVAN) injection 0-4 mg  0-4 mg Intravenous 4 times per day Mercedes Camprubi-Soms, PA-C   0 mg at 10/07/14 0201  . LORazepam (ATIVAN) injection 0-4 mg  0-4 mg Intravenous Q12H Mercedes Camprubi-Soms, PA-C   0 mg at 10/06/14 2350  . LORazepam (ATIVAN) tablet 0-4 mg  0-4 mg Oral 4 times per day Mercedes Camprubi-Soms, PA-C   0 mg at 10/07/14 0201  . LORazepam (ATIVAN) tablet 0-4 mg  0-4 mg Oral Q12H Mercedes Camprubi-Soms, PA-C   1 mg at 10/06/14 2350  . nicotine (NICODERM CQ - dosed in mg/24 hours) patch 21 mg  21 mg Transdermal Daily Mercedes Camprubi-Soms, PA-C      . ondansetron (ZOFRAN) tablet 4 mg  4 mg Oral Q8H PRN Mercedes Camprubi-Soms, PA-C      . thiamine (B-1) injection 100 mg  100 mg Intravenous Daily Mercedes Camprubi-Soms, PA-C      . thiamine (VITAMIN B-1) tablet 100 mg  100 mg Oral Daily Mercedes Camprubi-Soms, PA-C      . zolpidem (AMBIEN) tablet 5 mg  5 mg Oral QHS PRN Mercedes Camprubi-Soms, PA-C       Current Outpatient Prescriptions  Medication Sig Dispense Refill  . loratadine (CLARITIN) 10 MG tablet Take 10 mg by mouth daily as needed for allergies.    Bill Ferrell colchicine 0.6 MG tablet Take two tablets at once, then one tablet one hour later (Patient not taking: Reported on 10/06/2014) 3 tablet 0    Musculoskeletal: Strength & Muscle Tone: within normal limits Gait & Station: normal Patient leans: N/A  Psychiatric Specialty Exam: Physical Exam  Psychiatric: His mood appears anxious. His speech is slurred. He is slowed. Cognition and memory are normal. He expresses impulsivity. He exhibits a depressed mood. He expresses suicidal ideation. He expresses suicidal plans.    Review of Systems   Psychiatric/Behavioral: Positive for depression and suicidal ideas.  All other systems reviewed and are negative.   Blood pressure 146/89, pulse 78, temperature 98.7 F (37.1 C), temperature source Oral, resp. rate 18, SpO2 97 %.There is no height or weight on file to calculate BMI.  General Appearance: Casual and Disheveled  Eye Contact::  Fair  Speech:  Garbled  Volume:  Decreased  Mood:  Anxious and Depressed  Affect:  Constricted and Depressed  Thought Process:  Coherent  Orientation:  Full (Time, Place, and Person)  Thought Content:  Rumination  Suicidal Thoughts:  Yes.  without intent/plan  Homicidal Thoughts:  No  Memory:  Immediate;   Fair Recent;   Fair Remote;   Fair  Judgement:  Fair  Insight:  Lacking  Psychomotor Activity:  Decreased  Concentration:  Fair  Recall:  [employer] of Knowledge:Fair  Language: Fair  Akathisia:  No  Handed:  Right  AIMS (if indicated):     Assets:  Communication Skills Desire for Improvement Resilience  ADL's:  Intact  Cognition: WNL  Sleep:      Medical Decision Making: Review of Psycho-Social Stressors (1), Established Problem, Worsening (2) and Review or order medicine tests (1)  Treatment Plan Summary: Daily contact with patient to assess and evaluate symptoms and progress in treatment and Medication management  Plan:  Recommend psychiatric Inpatient admission when medically cleared. Disposition: will try to find patient inpatient substance abuse treatment program  Tenny Craw [facility] 10/07/2014 12:35 PM

## 2014-10-07 NOTE — BH Assessment (Signed)
[facility] who requested a letter from a medical doctor stating that pt was no longer suicidal due to the fact pt reported a suicide attempt  2 years ago. After reviewing the psychiatric exam pt expressed SI with a plan. Pt information will not be sent to [facility] at this time due to endorsing SI with a plan.

## 2014-10-08 DIAGNOSIS — F1410 Cocaine abuse, uncomplicated: Secondary | ICD-10-CM

## 2014-10-08 DIAGNOSIS — F1994 Other psychoactive substance use, unspecified with psychoactive substance-induced mood disorder: Secondary | ICD-10-CM | POA: Diagnosis present

## 2014-10-08 DIAGNOSIS — F10230 Alcohol dependence with withdrawal, uncomplicated: Secondary | ICD-10-CM

## 2014-10-08 MED ORDER — CLONIDINE HCL 0.1 MG PO TABS
0.1000 mg | ORAL_TABLET | Freq: Once | ORAL | Status: AC
  Administered 2014-10-08: 0.1 mg via ORAL
  Filled 2014-10-08: qty 1

## 2014-10-08 NOTE — Progress Notes (Addendum)
1:35pm. CSW spoke with [facility] and completed a verbal interview assessment for pt. Vernona RiegerLaura stated she would look at calendar to see if there was an available time for pt to be admitted. CSW awaiting call back.  2:46pm. Per Vernona RiegerLaura, [facility] because MD says he does not meet criteria for detox.  Pt is still under review at [facility].  3:11pm. Per Joni ReiningNicole, pt has been accepted to [facility]. Pt can be transported by [facility]. Call report to [phone].  Mariann LasterAlexandra Shaquitta Burbridge LCSWA,     ED CSW  phone: [phone]

## 2014-10-08 NOTE — Progress Notes (Signed)
CS[facility] for [facility].    Maryelizabeth Rowanressa Emilyann Banka, MSW, LCSW, LCAS-A Evening Clinical Social Worker [phone]

## 2014-10-08 NOTE — Consult Note (Signed)
[facility] Face-to-Face Psychiatry Consult   Reason for Consult:  Alcohol detox/dependence, cocaine abuse Referring Physician:  EDP Patient Identification: Bill Ferrell MRN:  [id number] Principal Diagnosis: Alcohol dependence with uncomplicated withdrawal Diagnosis:   Patient Active Problem List   Diagnosis Date Noted  . Alcohol dependence with uncomplicated withdrawal [F10.230] 10/08/2014    Priority: High  . Substance induced mood disorder [F19.94] 10/08/2014    Priority: High  . Cocaine abuse [F14.10] 10/08/2014    Priority: High    Total Time spent with patient: 30 minutes  Subjective:   Bill Ferrell is a 49 y.o. male patient transferred to [facility] for detox and substance abuse treatment.  HPI:  The patient has been drinking heavily daily with cocaine use.  He is in jeopardy of losing his job and marriage due to substance abuse.  Bill Ferrell has not received treatment or rehab.  [facility] has accepted the patient and will transfer to continue her treatment.  Stable for transfer, denies suicidal/homicidal ideations, hallucinations.  Past Medical History:  Past Medical History  Diagnosis Date  . Hypertension   . Gout    History reviewed. No pertinent past surgical history. Family History: No family history on file. Social History:  History  Alcohol Use  . Yes     History  Drug Use Not on file    History   Social History  . Marital Status: Married    Spouse Name: N/A  . Number of Children: N/A  . Years of Education: N/A   Social History Main Topics  . Smoking status: Current Every Day Smoker  . Smokeless tobacco: Not on file  . Alcohol Use: Yes  . Drug Use: Not on file  . Sexual Activity: Not on file   Other Topics Concern  . None   Social History Narrative   Additional Social History:    Pain Medications: Pt denies abuse.  Prescriptions: Pt denies abuse. Over the Counter: Pt denies abuse.  History of alcohol / drug use?: Yes Negative Consequences of Use: Legal, Work /  Programmer, multimedia, Personal relationships, Financial Name of Substance 1: Alcohol  1 - Age of First Use: 14 1 - Amount (size/oz): "2-40oz" 1 - Frequency: daily  1 - Duration: 4 mos.  1 - Last Use / Amount: 10-06-14 BAL- 94 Name of Substance 2: Cocaine  2 - Age of First Use: 30 2 - Amount (size/oz): "1 gram" 2 - Frequency: every other day  2 - Duration: ongoing  2 - Last Use / Amount: 10-06-14                 Allergies:  No Known Allergies  Vitals: Blood pressure 154/97, pulse 71, temperature 98.1 F (36.7 C), temperature source Oral, resp. rate 18, SpO2 100 %.  Risk to Self: Suicidal Ideation: No-Not Currently/Within Last 6 Months Suicidal Intent: No Is patient at risk for suicide?: No Suicidal Plan?: No Access to Means: No What has been your use of drugs/alcohol within the last 12 months?: Daily alcohol use reported. How many times?: 1 Other Self Harm Risks: No other self harm risk identified at this time.  Triggers for Past Attempts: Unpredictable Intentional Self Injurious Behavior: None Risk to Others: Homicidal Ideation: No Thoughts of Harm to Others: No Current Homicidal Intent: No Current Homicidal Plan: No Access to Homicidal Means: No Identified Victim: NA History of harm to others?: No Assessment of Violence: None Noted Violent Behavior Description: No violent behaviors observed at this time. Pt is calm and cooperative.  Does patient have access to weapons?: No Criminal Charges Pending?: No Does patient have a court date: No Prior Inpatient Therapy: Prior Inpatient Therapy: Yes Prior Therapy Dates: 2006 Prior Therapy Facilty/Provider(s): [facility]  Reason for Treatment: Substance Abuse  Prior Outpatient Therapy: Prior Outpatient Therapy: No  Current Facility-Administered Medications  Medication Dose Route Frequency Provider Last Rate Last Dose  . acetaminophen (TYLENOL) tablet 650 mg  650 mg Oral Q4H PRN Mercedes Camprubi-Soms, PA-C      . alum & mag  hydroxide-simeth (MAALOX/MYLANTA) 200-200-20 MG/5ML suspension 30 mL  30 mL Oral PRN Mercedes Camprubi-Soms, PA-C      . ibuprofen (ADVIL,MOTRIN) tablet 600 mg  600 mg Oral Q8H PRN Mercedes Camprubi-Soms, PA-C      . LORazepam (ATIVAN) injection 0-4 mg  0-4 mg Intravenous 4 times per day Mercedes Camprubi-Soms, PA-C   Stopped at 10/08/14 0603  . LORazepam (ATIVAN) injection 0-4 mg  0-4 mg Intravenous Q12H Mercedes Camprubi-Soms, PA-C   Stopped at 10/08/14 1007  . LORazepam (ATIVAN) tablet 0-4 mg  0-4 mg Oral 4 times per day Mercedes Camprubi-Soms, PA-C   Stopped at 10/08/14 0602  . LORazepam (ATIVAN) tablet 0-4 mg  0-4 mg Oral Q12H Mercedes Camprubi-Soms, PA-C   Stopped at 10/08/14 1008  . nicotine (NICODERM CQ - dosed in mg/24 hours) patch 21 mg  21 mg Transdermal Daily Mercedes Camprubi-Soms, PA-C   Stopped at 10/08/14 1008  . ondansetron (ZOFRAN) tablet 4 mg  4 mg Oral Q8H PRN Mercedes Camprubi-Soms, PA-C      . thiamine (B-1) injection 100 mg  100 mg Intravenous Daily Mercedes Camprubi-Soms, PA-C      . thiamine (VITAMIN B-1) tablet 100 mg  100 mg Oral Daily Mercedes Camprubi-Soms, PA-C   Stopped at 10/08/14 1009  . zolpidem (AMBIEN) tablet 5 mg  5 mg Oral QHS PRN Mercedes Camprubi-Soms, PA-C   5 mg at 10/07/14 2112   Current Outpatient Prescriptions  Medication Sig Dispense Refill  . loratadine (CLARITIN) 10 MG tablet Take 10 mg by mouth daily as needed for allergies.    [name] colchicine 0.6 MG tablet Take two tablets at once, then one tablet one hour later (Patient not taking: Reported on 10/06/2014) 3 tablet 0    Musculoskeletal: Strength & Muscle Tone: within normal limits Gait & Station: normal Patient leans: N/A  Psychiatric Specialty Exam:     Blood pressure 154/97, pulse 71, temperature 98.1 F (36.7 C), temperature source Oral, resp. rate 18, SpO2 100 %.There is no height or weight on file to calculate BMI.  General Appearance: Casual  Eye Contact::  Good  Speech:  Normal Rate   Volume:  Normal  Mood:  Anxious  Affect:  Congruent  Thought Process:  Coherent  Orientation:  Full (Time, Place, and Person)  Thought Content:  WDL  Suicidal Thoughts:  No  Homicidal Thoughts:  No  Memory:  Immediate;   Good Recent;   Good Remote;   Good  Judgement:  Fair  Insight:  Fair  Psychomotor Activity:  Decreased  Concentration:  Good  Recall:  Good  Fund of Knowledge:Good  Language: Good  Akathisia:  No  Handed:  Right  AIMS (if indicated):     Assets:  Housing Leisure Time Physical Health Resilience Social Support  ADL's:  Intact  Cognition: WNL  Sleep:      Medical Decision Making: Review of Psycho-Social Stressors (1), Review or order clinical lab tests (1) and Review of Medication Regimen & Side Effects (2)  Treatment Plan Summary: Daily contact with patient to assess and evaluate symptoms and progress in treatment, Medication management and Plan transfer to [facility] for detox and substance abuse treatment.  Disposition:  Transferred to [facility] for detox and substance abuse treatment.  Nanine MeansLORD, JAMISON, PMH-NP 10/08/2014 3:16 PM Patient seen and I agree with assessment and plan  Jamse BelfastDeborah Ross M.D.

## 2014-10-08 NOTE — Progress Notes (Signed)
Patient denies suicidal/homicidal ideations, hallucinations.  Denies seizure withdrawal history from alcohol.  Calm, cooperative, supportive wife at the bedside.  He wants to get help with his alcohol dependence and cocaine use.  Nanine MeansJamison Keyleen Cerrato, PMH-NP

## 2014-10-08 NOTE — Progress Notes (Signed)
CARE MANAGEMENT NOTE 10/08/2014  Patient:  Ann MakiYSON,Kel L   Account Number:  [id number]  Date Initiated:  10/08/2014  Documentation initiated by:  [facility]Olyn Landstrom  Subjective/Objective Assessment:   depression, ETOH, drug abuse     Action/Plan:   Anticipated DC Date:  10/08/2014   Anticipated DC Plan:  IP REHAB FACILITY      DC Planning Services  CM consult      Choice offered to / List presented to:             Status of service:  Completed, signed off Medicare Important Message given?   (If response is "NO", the following Medicare IM given date fields will be blank) Date Medicare IM given:   Medicare IM given by:   Date Additional Medicare IM given:   Additional Medicare IM given by:    Discharge Disposition:  IP REHAB FACILITY  Per UR Regulation:    If discussed at Long Length of Stay Meetings, dates discussed:    Comments:  10/08/2014 1645 NCM spoke to pt and [facility]. States he works and plans to get his life together. States he is married and does not want to lose his marriage. NCM educated pt on the importance of following rehab program to help him overcome his drug problem. [facility]. Pt states he works two jobs and plans to continue to work. Isidoro DonningAlesia Omar Gayden RN CCM Case Mgmt phone [phone]

## 2014-12-20 ENCOUNTER — Emergency Department (HOSPITAL_COMMUNITY)
Admission: EM | Admit: 2014-12-20 | Discharge: 2014-12-20 | Disposition: A | Discharge status: Home / Self Care | Payer: No Typology Code available for payment source | Attending: Emergency Medicine | Admitting: Emergency Medicine

## 2014-12-20 ENCOUNTER — Encounter (HOSPITAL_COMMUNITY): Payer: Self-pay | Admitting: Emergency Medicine

## 2014-12-20 ENCOUNTER — Emergency Department (INDEPENDENT_AMBULATORY_CARE_PROVIDER_SITE_OTHER)
Admission: EM | Admit: 2014-12-20 | Discharge: 2014-12-20 | Disposition: A | Discharge status: Nursing Home (Includes ICF) | Payer: No Typology Code available for payment source | Source: Home / Self Care | Attending: Family Medicine | Admitting: Family Medicine

## 2014-12-20 ENCOUNTER — Encounter (HOSPITAL_COMMUNITY): Payer: Self-pay | Admitting: Family Medicine

## 2014-12-20 ENCOUNTER — Emergency Department (HOSPITAL_COMMUNITY): Payer: No Typology Code available for payment source

## 2014-12-20 DIAGNOSIS — R0602 Shortness of breath: Secondary | ICD-10-CM | POA: Insufficient documentation

## 2014-12-20 DIAGNOSIS — R0789 Other chest pain: Secondary | ICD-10-CM

## 2014-12-20 DIAGNOSIS — I10 Essential (primary) hypertension: Secondary | ICD-10-CM | POA: Insufficient documentation

## 2014-12-20 DIAGNOSIS — Z8739 Personal history of other diseases of the musculoskeletal system and connective tissue: Secondary | ICD-10-CM | POA: Insufficient documentation

## 2014-12-20 DIAGNOSIS — R05 Cough: Secondary | ICD-10-CM | POA: Insufficient documentation

## 2014-12-20 DIAGNOSIS — Z79899 Other long term (current) drug therapy: Secondary | ICD-10-CM | POA: Insufficient documentation

## 2014-12-20 DIAGNOSIS — R079 Chest pain, unspecified: Secondary | ICD-10-CM

## 2014-12-20 DIAGNOSIS — Z720 Tobacco use: Secondary | ICD-10-CM | POA: Insufficient documentation

## 2014-12-20 LAB — BASIC METABOLIC PANEL
Anion gap: 9 (ref 5–15)
BUN: 11 mg/dL (ref 6–20)
CO2: 27 mmol/L (ref 22–32)
CREATININE: 0.99 mg/dL (ref 0.61–1.24)
Calcium: 9 mg/dL (ref 8.9–10.3)
Chloride: 105 mmol/L (ref 101–111)
GLUCOSE: 94 mg/dL (ref 65–99)
Potassium: 4.2 mmol/L (ref 3.5–5.1)
SODIUM: 141 mmol/L (ref 135–145)

## 2014-12-20 LAB — CBC WITH DIFFERENTIAL/PLATELET
BASOS ABS: 0 10*3/uL (ref 0.0–0.1)
BASOS PCT: 1 % (ref 0–1)
Eosinophils Absolute: 0.1 10*3/uL (ref 0.0–0.7)
Eosinophils Relative: 4 % (ref 0–5)
HEMATOCRIT: 45.4 % (ref 39.0–52.0)
Hemoglobin: 15.6 g/dL (ref 13.0–17.0)
Lymphocytes Relative: 44 % (ref 12–46)
Lymphs Abs: 1.6 10*3/uL (ref 0.7–4.0)
MCH: 30.6 pg (ref 26.0–34.0)
MCHC: 34.4 g/dL (ref 30.0–36.0)
MCV: 89 fL (ref 78.0–100.0)
Monocytes Absolute: 0.3 10*3/uL (ref 0.1–1.0)
Monocytes Relative: 8 % (ref 3–12)
NEUTROS PCT: 43 % (ref 43–77)
Neutro Abs: 1.6 10*3/uL — ABNORMAL LOW (ref 1.7–7.7)
Platelets: 273 10*3/uL (ref 150–400)
RBC: 5.1 MIL/uL (ref 4.22–5.81)
RDW: 13.6 % (ref 11.5–15.5)
WBC: 3.6 10*3/uL — ABNORMAL LOW (ref 4.0–10.5)

## 2014-12-20 LAB — I-STAT TROPONIN, ED
Troponin i, poc: 0 ng/mL (ref 0.00–0.08)
Troponin i, poc: 0 ng/mL (ref 0.00–0.08)

## 2014-12-20 MED ORDER — BENZONATATE 100 MG PO CAPS
100.0000 mg | ORAL_CAPSULE | Freq: Once | ORAL | Status: AC
  Administered 2014-12-20: 100 mg via ORAL
  Filled 2014-12-20: qty 1

## 2014-12-20 MED ORDER — BENZONATATE 100 MG PO CAPS: 100.0000 mg | ORAL_CAPSULE | Freq: Three times a day (TID) | ORAL | Status: DC

## 2014-12-20 MED ORDER — METHOCARBAMOL 500 MG PO TABS
1000.0000 mg | ORAL_TABLET | Freq: Once | ORAL | Status: AC
  Administered 2014-12-20: 1000 mg via ORAL
  Filled 2014-12-20: qty 2

## 2014-12-20 MED ORDER — METHOCARBAMOL 500 MG PO TABS: 1000.0000 mg | ORAL_TABLET | Freq: Four times a day (QID) | ORAL | Status: DC

## 2014-12-20 NOTE — ED Provider Notes (Signed)
Bill Ferrell is a 49 y.o. male who presents to Urgent Care today for left-sided chest pain. Patient developed acute onset of severe left-sided chest pain today. The pain is not worse with deep inspiration or cough. The pain is not worse with exertion. He denies any palpitations. He notes mild shortness of breath and cough. He denies any leg swelling or recent immobility. He denies a personal history of DVT or PE. He's tried some Gas-X which did not help. No fevers or chills. His medical problems are significant for hypertension, smoking, and history of cocaine use.   Past Medical History  Diagnosis Date  . Hypertension   . Gout    History reviewed. No pertinent past surgical history. History  Substance Use Topics  . Smoking status: Current Every Day Smoker  . Smokeless tobacco: Not on file  . Alcohol Use: Yes   ROS as above Medications: No current facility-administered medications for this encounter.   Current Outpatient Prescriptions  Medication Sig Dispense Refill  . lisinopril (PRINIVIL,ZESTRIL) 10 MG tablet Take 10 mg by mouth daily.    [name]. loratadine (CLARITIN) 10 MG tablet Take 10 mg by mouth daily as needed for allergies.    [name]. colchicine 0.6 MG tablet Take two tablets at once, then one tablet one hour later (Patient not taking: Reported on 10/06/2014) 3 tablet 0   No Known Allergies   Exam:  BP 164/110 mmHg  Pulse 71  Temp(Src) 97.7 F (36.5 C) (Oral)  SpO2 100% Gen: Well NAD HEENT: EOMI,  MMM Lungs: Normal work of breathing. CTABL Heart: RRR no MRG Abd: NABS, Soft. Nondistended, Nontender Exts: Brisk capillary refill, warm and well perfused. No leg swelling, palpable cords, or calf tenderness bilaterally.  ED ECG REPORT   Date: 12/20/2014  Rate: 66  Rhythm: normal sinus rhythm  QRS Axis: normal  Intervals: normal  ST/T Wave abnormalities: normal  Conduction Disutrbances:none  Narrative Interpretation:   Old EKG Reviewed: unchanged  I have personally reviewed  the EKG tracing and agree with the computerized printout as noted.   No results found for this or any previous visit (from the past 24 hour(s)). No results found.  Assessment and Plan: 49 y.o. male with left-sided chest pain. Unclear etiology.  Certainly he has multiple risk factors including male over the age of 51, smoking status, hypertension, and history of drug abuse. We'll transfer to the emergency department for further evaluation and management. He is stable for transfer.  Discussed warning signs or symptoms. Please see discharge instructions. Patient expresses understanding.     Rodolph BongEvan S Natassia Guthridge, MD 12/20/14 [phone]

## 2014-12-20 NOTE — ED Provider Notes (Signed)
CSN: [id number]     Arrival date & time 12/20/14  7829 History   First MD Initiated Contact with Patient 12/20/14 1005     Chief Complaint  Patient presents with  . Chest Pain     (Consider location/radiation/quality/duration/timing/severity/associated sxs/prior Treatment) HPI Comments: Patient presents with one-week history of cough and chest pain which started at 6:30 AM today. Patient was seen at urgent care for his chest pain and transferred to the emergency department. Patient describes sharp paroxysms of pain in his left chest which come and go, lasts for several seconds at a time and then resolve. It is associated with mild shortness of breath. Patient tried Gas-X tablets which did not help. No diaphoresis, palpitations, nausea or vomiting. Patient has a productive cough. No fevers or URI symptoms. Patient recently has insurance and had a physical exam. He was placed on Claritin for allergies. He was placed on lisinopril for blood pressure. Cough started after initiation of lisinopril. No angioedema. Patient denies risk factors for pulmonary embolism including: unilateral leg swelling, history of DVT/PE/other blood clots, use of estrogens, recent immobilizations, recent surgery, recent travel (>4hr segment), malignancy, hemoptysis.     The history is provided by the patient and medical records.    Past Medical History  Diagnosis Date  . Hypertension   . Gout    History reviewed. No pertinent past surgical history. History reviewed. No pertinent family history. History  Substance Use Topics  . Smoking status: Current Every Day Smoker  . Smokeless tobacco: Not on file  . Alcohol Use: Yes    Review of Systems  Constitutional: Negative for fever and diaphoresis.  Eyes: Negative for redness.  Respiratory: Positive for cough and shortness of breath.   Cardiovascular: Positive for chest pain. Negative for palpitations and leg swelling.  Gastrointestinal: Negative for nausea,  vomiting and abdominal pain.  Genitourinary: Negative for dysuria.  Musculoskeletal: Negative for back pain and neck pain.  Skin: Negative for rash.  Neurological: Negative for syncope and light-headedness.  Psychiatric/Behavioral: The patient is not nervous/anxious.     Allergies  Review of patient's allergies indicates no known allergies.  Home Medications   Prior to Admission medications   Medication Sig Start Date End Date Taking? Authorizing Provider  lisinopril (PRINIVIL,ZESTRIL) 10 MG tablet Take 10 mg by mouth daily.   Yes Historical Provider, MD  loratadine (CLARITIN) 10 MG tablet Take 10 mg by mouth daily as needed for allergies.   Yes Historical Provider, MD  Multiple Vitamins-Minerals (MULTIVITAMIN WITH MINERALS) tablet Take 1 tablet by mouth daily.   Yes Historical Provider, MD  simethicone (MYLICON) 80 MG chewable tablet Chew 160 mg by mouth every 6 (six) hours as needed for flatulence.   Yes Historical Provider, MD  benzonatate (TESSALON) 100 MG capsule Take 1 capsule (100 mg total) by mouth every 8 (eight) hours. 12/20/14   [name], PA-C  methocarbamol (ROBAXIN) 500 MG tablet Take 2 tablets (1,000 mg total) by mouth 4 (four) times daily. 12/20/14   [name], PA-C   BP 141/73 mmHg  Pulse 82  Temp(Src) 98.1 F (36.7 C) (Oral)  Resp 16  SpO2 99%   Physical Exam  Constitutional: He appears well-developed and well-nourished.  Patient has intermittent spasms of pain in L chest during my exam and winces with pain.   HENT:  Head: Normocephalic and atraumatic.  Mouth/Throat: Mucous membranes are normal. Mucous membranes are not dry.  Eyes: Conjunctivae are normal. Right eye exhibits no discharge. Left eye exhibits no  discharge.  Neck: Trachea normal and normal range of motion. Neck supple. Normal carotid pulses and no JVD present. No muscular tenderness present. Carotid bruit is not present. No tracheal deviation present.  Cardiovascular: Normal rate, regular  rhythm, S1 normal, S2 normal, normal heart sounds and intact distal pulses.  Exam reveals no distant heart sounds and no decreased pulses.   No murmur heard. Pulmonary/Chest: Effort normal and breath sounds normal. No respiratory distress. He has no wheezes. He has no rales. He exhibits no tenderness.  Abdominal: Soft. Normal aorta and bowel sounds are normal. There is no tenderness. There is no rebound and no guarding.  Musculoskeletal: He exhibits no edema.  Neurological: He is alert.  Skin: Skin is warm and dry. He is not diaphoretic. No cyanosis. No pallor.  Psychiatric: He has a normal mood and affect.  Nursing note and vitals reviewed.   ED Course  Procedures (including critical care time) Labs Review Labs Reviewed  CBC WITH DIFFERENTIAL/PLATELET - Abnormal; Notable for the following:    WBC 3.6 (*)    Neutro Abs 1.6 (*)    All other components within normal limits  BASIC METABOLIC PANEL  I-STAT TROPOININ, ED  Rosezena Sensor, ED    Imaging Review Dg Chest 2 View  12/20/2014   CLINICAL DATA:  Acute onset left-sided chest pain approximately 6 hr ago.  EXAM: CHEST  2 VIEW  COMPARISON:  10/06/2014, 04/12/2010.  FINDINGS: Cardiomediastinal silhouette unremarkable, unchanged. Chronic pleural scarring with blunting of the costophrenic angles bilaterally. Lungs clear. Bronchovascular markings normal. Pulmonary vascularity normal. No visible pleural effusions. No pneumothorax. 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  IMPRESSION: No acute cardiopulmonary disease.   Electronically Signed   By: Hulan Saas M.D.   On: 12/20/2014 12:24     EKG Interpretation None       10:32 AM Patient seen and examined. Work-up initiated. Medications ordered. EKG reviewed.   Vital signs reviewed and are as follows: BP 141/73 mmHg  Pulse 82  Temp(Src) 98.1 F (36.7 C) (Oral)  Resp 16  SpO2 99%  3:06 PM 2nd marker neg. Pt continues  to have paroxysms of pain, atypical for CAD. Will treat with Robaxin and Tessalon. Patient also continues to cough during exam. He will follow-up with his new PCP for further evaluation.  Patient was counseled to return with severe chest pain, especially if the pain is crushing or pressure-like and spreads to the arms, back, neck, or jaw, or if they have sweating, nausea, or shortness of breath with the pain. They were encouraged to call 911 with these symptoms.   They were also told to return if their chest pain gets worse and does not go away with rest, they have an attack of chest pain lasting longer than usual despite rest and treatment with the medications their caregiver has prescribed, if they wake from sleep with chest pain or shortness of breath, if they feel dizzy or faint, if they have chest pain not typical of their usual pain, or if they have any other emergent concerns regarding their health.  The patient verbalized understanding and agreed.    MDM   Final diagnoses:  Chest pain, unspecified chest pain type   Patient with paroxysms of left sided chest pain as described above. Cardiac evaluation in emergency department is negative for elevated troponins, abnormal EKG. Patient's story is atypical for chest pain. Patient does not have any significant risk  factors for PE and presentation would be atypical for a period presentation would also be atypical for aortic aneurysm or dissection. Chest x-ray is otherwise clear. Will give Tessalon for cough. Patient encouraged to follow-up with PCP to consider ACE inhibitor as causing cough, he may need trial off of this if cough does not improve. Will give Robaxin to see if this helps with muscle spasm.  No dangerous or life-threatening conditions suspected or identified by history, physical exam, and by work-up. No indications for hospitalization identified.     Renne CriglerJoshua Yoav Okane, PA-C 12/20/14 1510  Geoffery Lyonsouglas Delo, MD 12/22/14 1447

## 2014-12-20 NOTE — Discharge Instructions (Signed)
Please read and follow all provided instructions.  Your diagnoses today include:  1. Chest pain, unspecified chest pain type    Tests performed today include:  An EKG of your heart  A chest x-ray  Cardiac enzymes - a blood test for heart muscle damage, no sign of heart attack  Blood counts and electrolytes  Vital signs. See below for your results today.   Medications prescribed:   Robaxin (methocarbamol) - muscle relaxer medication  DO NOT drive or perform any activities that require you to be awake and alert because this medicine can make you drowsy.   Take any prescribed medications only as directed.  Follow-up instructions: Please follow-up with your primary care provider as soon as you can for further evaluation of your symptoms.   Return instructions:  SEEK IMMEDIATE MEDICAL ATTENTION IF:  You have severe chest pain, especially if the pain is crushing or pressure-like and spreads to the arms, back, neck, or jaw, or if you have sweating, nausea (feeling sick to your stomach), or shortness of breath. THIS IS AN EMERGENCY. Don't wait to see if the pain will go away. Get medical help at once. Call 911 or 0 (operator). DO NOT drive yourself to the hospital.   Your chest pain gets worse and does not go away with rest.   You have an attack of chest pain lasting longer than usual, despite rest and treatment with the medications your caregiver has prescribed.   You wake from sleep with chest pain or shortness of breath.  You feel dizzy or faint.  You have chest pain not typical of your usual pain for which you originally saw your caregiver.   You have any other emergent concerns regarding your health.  Additional Information: Chest pain comes from many different causes. Your caregiver has diagnosed you as having chest pain that is not specific for one problem, but does not require admission.  You are at low risk for an acute heart condition or other serious illness.   Your  vital signs today were: BP 142/84 mmHg   Pulse 56   Temp(Src) 98.1 F (36.7 C) (Oral)   Resp 15   SpO2 98% If your blood pressure (BP) was elevated above 135/85 this visit, please have this repeated by your doctor within one month. --------------

## 2014-12-20 NOTE — ED Notes (Signed)
Pt woke up this morning with a sharp pain in his left chest.  He states it has not gone away since this morning and he has become increasingly more SOB.  He denies any headache, dizziness, numbness or tingling, or palpitations.

## 2014-12-20 NOTE — ED Notes (Signed)
Provider at the bedside.  

## 2014-12-20 NOTE — ED Notes (Signed)
Patient transported to X-ray 

## 2014-12-20 NOTE — ED Notes (Signed)
[facility]. sts worse with cough. sts sharp and radiates down left arm. sts productive cough.

## 2015-09-26 ENCOUNTER — Emergency Department (INDEPENDENT_AMBULATORY_CARE_PROVIDER_SITE_OTHER)
Admission: EM | Admit: 2015-09-26 | Discharge: 2015-09-26 | Disposition: A | Discharge status: Home / Self Care | Payer: Self-pay | Source: Home / Self Care | Attending: Family Medicine | Admitting: Family Medicine

## 2015-09-26 ENCOUNTER — Encounter (HOSPITAL_COMMUNITY): Payer: Self-pay | Admitting: *Deleted

## 2015-09-26 DIAGNOSIS — L02412 Cutaneous abscess of left axilla: Secondary | ICD-10-CM

## 2015-09-26 MED ORDER — DOXYCYCLINE HYCLATE 100 MG PO CAPS: 100.0000 mg | ORAL_CAPSULE | Freq: Two times a day (BID) | ORAL | Status: DC

## 2015-09-26 NOTE — ED Notes (Signed)
Pt  Has   A  Boil  Under  l   Arm          That         Has  Been there  For  About  1  Week        he  Has  Had these  Boils  In  Past

## 2015-09-26 NOTE — ED Provider Notes (Signed)
CSN: [id number]     Arrival date & time 09/26/15  1308 History   First MD Initiated Contact with Patient 09/26/15 1412     Chief Complaint  Patient presents with  . Abscess   (Consider location/radiation/quality/duration/timing/severity/associated sxs/prior Treatment) Patient is a 50 y.o. male presenting with abscess. The history is provided by the patient.  Abscess Location:  Shoulder/arm Shoulder/arm abscess location:  L axilla Abscess quality: fluctuance, induration, painful and redness   Red streaking: no   Duration:  10 days Progression:  Worsening Pain details:    Quality:  Throbbing and tightness   Severity:  Mild   Progression:  Worsening Chronicity:  New Relieved by:  Nothing Worsened by:  Nothing tried Ineffective treatments:  None tried Associated symptoms: no fever   Risk factors: prior abscess     Past Medical History  Diagnosis Date  . Hypertension   . Gout    History reviewed. No pertinent past surgical history. History reviewed. No pertinent family history. Social History  Substance Use Topics  . Smoking status: Current Every Day Smoker  . Smokeless tobacco: None  . Alcohol Use: Yes    Review of Systems  Constitutional: Negative.  Negative for fever.  Musculoskeletal: Negative.   Skin: Positive for rash.  All other systems reviewed and are negative.   Allergies  Review of patient's allergies indicates no known allergies.  Home Medications   Prior to Admission medications   Medication Sig Start Date End Date Taking? Authorizing Provider  benzonatate (TESSALON) 100 MG capsule Take 1 capsule (100 mg total) by mouth every 8 (eight) hours. 12/20/14   [name], PA-C  doxycycline (VIBRAMYCIN) 100 MG capsule Take 1 capsule (100 mg total) by mouth 2 (two) times daily. 09/26/15   [name], MD  lisinopril (PRINIVIL,ZESTRIL) 10 MG tablet Take 10 mg by mouth daily.    Historical Provider, MD  loratadine (CLARITIN) 10 MG tablet Take 10 mg by mouth  daily as needed for allergies.    Historical Provider, MD  methocarbamol (ROBAXIN) 500 MG tablet Take 2 tablets (1,000 mg total) by mouth 4 (four) times daily. 12/20/14   [name], PA-C  Multiple Vitamins-Minerals (MULTIVITAMIN WITH MINERALS) tablet Take 1 tablet by mouth daily.    Historical Provider, MD  simethicone (MYLICON) 80 MG chewable tablet Chew 160 mg by mouth every 6 (six) hours as needed for flatulence.    Historical Provider, MD   Meds Ordered and Administered this Visit  Medications - No data to display  BP 127/77 mmHg  Pulse 77  Temp(Src) 97.7 F (36.5 C) (Oral)  Resp 18  SpO2 97% No data found.   Physical Exam  Constitutional: He is oriented to person, place, and time. He appears well-developed and well-nourished. He appears distressed.  Musculoskeletal: He exhibits tenderness.  Neurological: He is alert and oriented to person, place, and time.  Skin: Skin is warm and dry. Rash noted.     Nursing note and vitals reviewed.   ED Course  .Marland KitchenIncision and Drainage Date/Time: 09/26/2015 2:35 PM Performed by: [name] Authorized by: Bradd Canary D Consent: Verbal consent obtained. Consent given by: patient Type: abscess Body area: trunk Location details: chest Local anesthetic: topical anesthetic Patient sedated: no Scalpel size: 11 Incision type: single straight Incision depth: dermal Complexity: simple Drainage: purulent Drainage amount: moderate Wound treatment: wound left open Patient tolerance: Patient tolerated the procedure well with no immediate complications   (including critical care time)  Labs Review Labs Reviewed -  No data to display  Imaging Review No results found.   Visual Acuity Review  Right Eye Distance:   Left Eye Distance:   Bilateral Distance:    Right Eye Near:   Left Eye Near:    Bilateral Near:         MDM   1. Abscess of axilla, left    I+D performed.    [name], MD 09/26/15 [phone]

## 2015-09-26 NOTE — Discharge Instructions (Signed)
Warm compress twice a day when you take the antibiotic, take all of medicine, return as needed. °

## 2015-11-23 ENCOUNTER — Encounter (HOSPITAL_COMMUNITY): Payer: Self-pay | Admitting: Emergency Medicine

## 2015-11-23 ENCOUNTER — Ambulatory Visit (HOSPITAL_COMMUNITY)
Admission: EM | Admit: 2015-11-23 | Discharge: 2015-11-23 | Disposition: A | Discharge status: Home / Self Care | Payer: Worker's Compensation | Attending: Internal Medicine | Admitting: Internal Medicine

## 2015-11-23 DIAGNOSIS — M5416 Radiculopathy, lumbar region: Secondary | ICD-10-CM | POA: Diagnosis not present

## 2015-11-23 DIAGNOSIS — S39012A Strain of muscle, fascia and tendon of lower back, initial encounter: Secondary | ICD-10-CM

## 2015-11-23 MED ORDER — CYCLOBENZAPRINE HCL 10 MG PO TABS: 10.0000 mg | ORAL_TABLET | Freq: Every day | ORAL | Status: DC

## 2015-11-23 MED ORDER — PREDNISONE 50 MG PO TABS: 50.0000 mg | ORAL_TABLET | Freq: Every day | ORAL | Status: AC

## 2015-11-23 NOTE — ED Notes (Signed)
Dr Dayton Scrapemurray completed a workers compensation form.  2 copies given to the patient.  One copy labeled for scanning into the medical record

## 2015-11-23 NOTE — ED Provider Notes (Signed)
CSN: [id number]     Arrival date & time 11/23/15  1950 History   First MD Initiated Contact with Patient 11/23/15 2143     Chief Complaint  Patient presents with  . Back Pain   HPI   50yo man with onset of L low back pain at work yesterday.  Works at [employer] and was lifting a drum of orange peels. No unusual leg weakness/clumsiness, no changes in bowels/bladder.  Has had back pain before but not quite like this.  Taking aleve without much relief.    Past Medical History  Diagnosis Date  . Hypertension   . Gout    History reviewed. No pertinent past surgical history.  Social History  Substance Use Topics  . Smoking status: Current Every Day Smoker  . Smokeless tobacco: None  . Alcohol Use: Yes    Review of Systems  All other systems reviewed and are negative.   Allergies  Review of patient's allergies indicates no known allergies.  Home Medications   Prior to Admission medications   Medication Sig Start Date End Date Taking? Authorizing Provider  benzonatate (TESSALON) 100 MG capsule Take 1 capsule (100 mg total) by mouth every 8 (eight) hours. Patient not taking: Reported on 11/23/2015 12/20/14   [name], PA-C  cyclobenzaprine (FLEXERIL) 10 MG tablet Take 1 tablet (10 mg total) by mouth at bedtime. 11/23/15   Eustace Moore, MD  doxycycline (VIBRAMYCIN) 100 MG capsule Take 1 capsule (100 mg total) by mouth 2 (two) times daily. Patient not taking: Reported on 11/23/2015 09/26/15   [name], MD  lisinopril (PRINIVIL,ZESTRIL) 10 MG tablet Take 10 mg by mouth daily.    Historical Provider, MD  loratadine (CLARITIN) 10 MG tablet Take 10 mg by mouth daily as needed for allergies.    Historical Provider, MD  methocarbamol (ROBAXIN) 500 MG tablet Take 2 tablets (1,000 mg total) by mouth 4 (four) times daily. 12/20/14   [name], PA-C  Multiple Vitamins-Minerals (MULTIVITAMIN WITH MINERALS) tablet Take 1 tablet by mouth daily.    Historical Provider, MD  predniSONE  (DELTASONE) 50 MG tablet Take 1 tablet (50 mg total) by mouth daily. 11/23/15 11/28/15  Eustace Moore, MD  simethicone (MYLICON) 80 MG chewable tablet Chew 160 mg by mouth every 6 (six) hours as needed for flatulence.    Historical Provider, MD   Meds Ordered and Administered this Visit  Medications - No data to display  BP 155/86 mmHg  Pulse 71  Temp(Src) 97.7 F (36.5 C) (Oral)  Resp 16  SpO2 97% No data found.   Physical Exam  Constitutional: He is oriented to person, place, and time. No distress.  Alert, nicely groomed  HENT:  Head: Atraumatic.  Eyes:  Conjugate gaze, no eye redness/drainage  Neck: Neck supple.  Cardiovascular: Normal rate.   Pulmonary/Chest: No respiratory distress.  Abdominal: He exhibits no distension.  Musculoskeletal: Normal range of motion.  Mildly tender to palpation L upper/mid buttock.  No erythema/warmth/rash/swelling.  No midline posterior tenderness to percussion.  Able to rise from chair, climb on/off exam table.  Able to walk independently.  Pain increases with change in position.    Neurological: He is alert and oriented to person, place, and time.  Skin: Skin is warm and dry.  No cyanosis  Nursing note and vitals reviewed.   ED Course  Procedures (including critical care time)  none  MDM   1. Low back strain, initial encounter   2. Acute left  lumbar radiculopathy    Meds ordered this encounter  Medications  . cyclobenzaprine (FLEXERIL) 10 MG tablet    Sig: Take 1 tablet (10 mg total) by mouth at bedtime.    Dispense:  20 tablet    Refill:  0  . predniSONE (DELTASONE) 50 MG tablet    Sig: Take 1 tablet (50 mg total) by mouth daily.    Dispense:  5 tablet    Refill:  0   Continue aleve ii tabs bid-tid.  Light duty restrictions written; can resume full duty 11/27/15.  Should followup with Occ Med if not able to do so.  Copies of Medical Status Questionnaire given to patient.      Eustace MooreLaura W Reinhardt Licausi, MD 11/25/15 2325

## 2015-11-23 NOTE — ED Notes (Signed)
Back pain that started yesterday.  Patient has tried ice/heat/motrin and no relief.

## 2015-11-23 NOTE — Discharge Instructions (Signed)
See worker's compensation Medical Status Questionnaire.  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 May return to full duty 11/27/15; if unable to do so, need to followup with Occupational Medicine. Prescriptions for cyclobenzaprine (muscle relaxer) and prednisone (steroid) sent to the [facility] on [address].

## 2017-07-31 ENCOUNTER — Encounter (HOSPITAL_COMMUNITY): Payer: Self-pay | Admitting: Emergency Medicine

## 2017-07-31 ENCOUNTER — Ambulatory Visit (HOSPITAL_COMMUNITY)
Admission: EM | Admit: 2017-07-31 | Discharge: 2017-07-31 | Disposition: A | Discharge status: Home / Self Care | Payer: Self-pay

## 2017-07-31 ENCOUNTER — Ambulatory Visit (INDEPENDENT_AMBULATORY_CARE_PROVIDER_SITE_OTHER): Payer: Self-pay

## 2017-07-31 DIAGNOSIS — W19XXXA Unspecified fall, initial encounter: Secondary | ICD-10-CM

## 2017-07-31 DIAGNOSIS — M25511 Pain in right shoulder: Secondary | ICD-10-CM

## 2017-07-31 DIAGNOSIS — M545 Low back pain, unspecified: Secondary | ICD-10-CM

## 2017-07-31 DIAGNOSIS — W1789XA Other fall from one level to another, initial encounter: Secondary | ICD-10-CM

## 2017-07-31 MED ORDER — PREDNISONE 20 MG PO TABS: 40.0000 mg | ORAL_TABLET | Freq: Every day | ORAL | 0 refills | Status: AC

## 2017-07-31 MED ORDER — NAPROXEN 500 MG PO TABS: 500.0000 mg | ORAL_TABLET | Freq: Two times a day (BID) | ORAL | 0 refills | Status: DC

## 2017-07-31 MED ORDER — CYCLOBENZAPRINE HCL 10 MG PO TABS: 10.0000 mg | ORAL_TABLET | Freq: Every day | ORAL | 0 refills | Status: DC

## 2017-07-31 NOTE — ED Provider Notes (Signed)
[facility]    CSN: [id number] Arrival date & time: 07/31/17  1255     History   Chief Complaint Chief Complaint  Patient presents with  . Fall    HPI Bill Ferrell is a 52 y.o. male presenting after a fall with right shoulder pain and right lumbar back pain. He fell on Saturday ( 6 days ago) off the tire wheel/bed of a truck, truck was stationary. States he fell on his right side. Deny hitting head and LOC. No headache or vision changes. Complaining of right shoulder pain, worse with movement. And back pain- worsens with straining and trying to sit up from laying position. Endorses pins and needles on lateral aspect of right thigh. Denies radiation of pain. Denies loss of bowel/bladder control. Denies saddle anesthesia.   HPI  Past Medical History:  Diagnosis Date  . Gout   . Hypertension     Patient Active Problem List   Diagnosis Date Noted  . Alcohol dependence with uncomplicated withdrawal (HCC) 10/08/2014  . Substance induced mood disorder (HCC) 10/08/2014  . Cocaine abuse (HCC) 10/08/2014    History reviewed. No pertinent surgical history.     Home Medications    Prior to Admission medications   Medication Sig Start Date End Date Taking? Authorizing Provider  colchicine 0.6 MG tablet Take 0.6 mg by mouth daily.   Yes [provider]  lisinopril (PRINIVIL,ZESTRIL) 10 MG tablet Take 10 mg by mouth daily.   Yes [provider]  loratadine (CLARITIN) 10 MG tablet Take 10 mg by mouth daily as needed for allergies.   Yes [provider]  benzonatate (TESSALON) 100 MG capsule Take 1 capsule (100 mg total) by mouth every 8 (eight) hours. Patient not taking: Reported on 11/23/2015 12/20/14   [name], PA-C  cyclobenzaprine (FLEXERIL) 10 MG tablet Take 1 tablet (10 mg total) by mouth at bedtime. 07/31/17   Kiyani Jernigan C, PA-C  doxycycline (VIBRAMYCIN) 100 MG capsule Take 1 capsule (100 mg total) by mouth 2 (two) times  daily. Patient not taking: Reported on 11/23/2015 09/26/15   [name], MD  methocarbamol (ROBAXIN) 500 MG tablet Take 2 tablets (1,000 mg total) by mouth 4 (four) times daily. 12/20/14   [name], PA-C  Multiple Vitamins-Minerals (MULTIVITAMIN WITH MINERALS) tablet Take 1 tablet by mouth daily.    [provider]  naproxen (NAPROSYN) 500 MG tablet Take 1 tablet (500 mg total) by mouth 2 (two) times daily. 07/31/17   Cleona Doubleday C, PA-C  predniSONE (DELTASONE) 20 MG tablet Take 2 tablets (40 mg total) by mouth daily for 3 days. 07/31/17 08/03/17  Kaydin Labo C, PA-C  simethicone (MYLICON) 80 MG chewable tablet Chew 160 mg by mouth every 6 (six) hours as needed for flatulence.    [provider]    Family History No family history on file.  Social History Social History   Tobacco Use  . Smoking status: Current Every Day Smoker  Substance Use Topics  . Alcohol use: Yes  . Drug use: Not on file     Allergies   Patient has no known allergies.   Review of Systems Review of Systems  Constitutional: Negative for fatigue.  Eyes: Negative for visual disturbance.  Respiratory: Negative for shortness of breath.   Cardiovascular: Negative for chest pain.  Gastrointestinal: Negative for abdominal pain, constipation, nausea and vomiting.  Genitourinary: Negative for difficulty urinating.  Musculoskeletal: Positive for back pain and myalgias. Negative for gait problem.  Skin: Negative for rash and wound.  Neurological: Negative for dizziness, weakness, light-headedness, numbness and headaches.     Physical Exam Triage Vital Signs ED Triage Vitals [07/31/17 1407]  Enc Vitals Group     BP (!) 142/98     Pulse Rate 78     Resp 16     Temp 97.7 F (36.5 C)     Temp Source Oral     SpO2 99 %     Weight      Height      Head Circumference      Peak Flow      Pain Score 9     Pain Loc      Pain Edu?      Excl. in GC?    No data found.  Updated Vital  Signs BP (!) 142/98   Pulse 78   Temp 97.7 F (36.5 C) (Oral)   Resp 16   SpO2 99%   Visual Acuity Right Eye Distance:   Left Eye Distance:   Bilateral Distance:    Right Eye Near:   Left Eye Near:    Bilateral Near:     Physical Exam  Constitutional: He is oriented to person, place, and time. He appears well-developed and well-nourished. No distress.  HENT:  Head: Normocephalic and atraumatic.  Eyes: Conjunctivae and EOM are normal. Pupils are equal, round, and reactive to light.  Neck: Normal range of motion. Neck supple.  Cardiovascular: Normal rate and regular rhythm.  No murmur heard. Pulmonary/Chest: Effort normal and breath sounds normal. No respiratory distress.  Abdominal: Soft. There is no tenderness.  Musculoskeletal: He exhibits no edema.  Right shoulder: no tenderness to palpation of right clavicle, acromion. Mild tenderness to para scapular muscles. Active ROM mildly limited due to pain  Back: no deformity, swelling or edema, no midline tenderness to cervical, thoracic or lumbar spine. Mild tenderness to right lumbar muscles. Negative SLR bilaterally.  Neurological: He is alert and oriented to person, place, and time. No cranial nerve deficit or sensory deficit. Coordination normal.  Gait without abnormality No loss of sensation to right lateral thigh.  Skin: Skin is warm and dry.  Psychiatric: He has a normal mood and affect.  Nursing note and vitals reviewed.    UC Treatments / Results  Labs (all labs ordered are listed, but only abnormal results are displayed) Labs Reviewed - No data to display  EKG  EKG Interpretation None       Radiology Dg Shoulder Right  Result Date: 07/31/2017 CLINICAL DATA:  Right shoulder pain since a fall from a truck while moving furniture 1 week ago. Initial encounter. EXAM: RIGHT SHOULDER - 2+ VIEW COMPARISON:  None. FINDINGS: There is no acute bony or joint abnormality. No evidence of arthropathy. Imaged right lung  and ribs appear normal. IMPRESSION: Negative exam. Electronically Signed   By: Drusilla Kanner M.D.   On: 07/31/2017 15:19    Procedures Procedures (including critical care time)  Medications Ordered in UC Medications - No data to display   Initial Impression / Assessment and Plan / UC Course  I have reviewed the triage vital signs and the nursing notes.  Pertinent labs & imaging results that were available during my care of the patient were reviewed by me and considered in my medical decision making (see chart for details).     Xr of shoulder obtained- no evidence of fracture. Imaging deferred on back as tenderness was not midline. Will treat with anti-inflammatories,  flexeril in evening for sleep and 3 days of prednisone for any nerve inflammation. Discussed strict return precautions. Patient verbalized understanding and is agreeable with plan.   Final Clinical Impressions(s) / UC Diagnoses   Final diagnoses:  Fall, initial encounter  Acute pain of right shoulder  Acute right-sided low back pain without sciatica    ED Discharge Orders        Ordered    cyclobenzaprine (FLEXERIL) 10 MG tablet  Daily at bedtime     07/31/17 1526    naproxen (NAPROSYN) 500 MG tablet  2 times daily     07/31/17 1526    predniSONE (DELTASONE) 20 MG tablet  Daily     07/31/17 1526       Controlled Substance Prescriptions [place] Controlled Substance Registry consulted? Not Applicable   Lew DawesWieters, Infantof Villagomez C, [place] 07/31/17 1542

## 2017-07-31 NOTE — ED Triage Notes (Signed)
PT fell out of the bed of a stationary pickup truck on Saturday. PT fell on his right right. PT reports bilateral shoulder pain, back pain, and tingling pain in right leg.

## 2017-07-31 NOTE — Discharge Instructions (Signed)
No fracture or dislocation on exam.   Pain is most likely a muscular strain/bruising from your fall. Pain may take 2 weeks to resolve.  Naprosyn twice daily for inflammation, Flexeril 10 mg in the evening to help sleep.  Use anti-inflammatories for pain/swelling. You may take up to 800 mg Ibuprofen every 8 hours with food. You may supplement Ibuprofen with Tylenol [phone] mg every 8 hours.   Please alternate using ice/heating pad to help with any discomfort.   Please follow up here or with your primary care if symptoms not improving in 2 weeks. Please return sooner if pain changes, worsens, develop numbness, tingling.

## 2019-03-01 ENCOUNTER — Other Ambulatory Visit: Payer: Self-pay

## 2019-03-01 DIAGNOSIS — Z20822 Contact with and (suspected) exposure to covid-19: Secondary | ICD-10-CM

## 2019-03-02 LAB — NOVEL CORONAVIRUS, NAA: SARS-CoV-2, NAA: NOT DETECTED

## 2019-03-03 ENCOUNTER — Telehealth: Payer: Self-pay

## 2019-03-03 NOTE — Telephone Encounter (Signed)
Pt. Called, given COVID 19 test results.

## 2019-05-11 ENCOUNTER — Other Ambulatory Visit: Payer: Self-pay

## 2019-05-11 DIAGNOSIS — Z20822 Contact with and (suspected) exposure to covid-19: Secondary | ICD-10-CM

## 2019-05-12 LAB — NOVEL CORONAVIRUS, NAA: SARS-CoV-2, NAA: NOT DETECTED

## 2019-07-05 ENCOUNTER — Other Ambulatory Visit: Payer: Self-pay

## 2019-07-05 DIAGNOSIS — Z20822 Contact with and (suspected) exposure to covid-19: Secondary | ICD-10-CM

## 2019-07-06 LAB — NOVEL CORONAVIRUS, NAA: SARS-CoV-2, NAA: NOT DETECTED

## 2019-07-08 ENCOUNTER — Telehealth: Payer: Self-pay | Admitting: General Practice

## 2019-07-08 NOTE — Telephone Encounter (Signed)
°  Pt rec neg COVID  Results  °

## 2019-07-18 ENCOUNTER — Other Ambulatory Visit: Payer: Self-pay | Admitting: *Deleted

## 2019-11-11 ENCOUNTER — Other Ambulatory Visit (HOSPITAL_COMMUNITY): Payer: Self-pay | Admitting: Nurse Practitioner

## 2020-02-03 ENCOUNTER — Other Ambulatory Visit (HOSPITAL_COMMUNITY): Payer: Self-pay | Admitting: Nurse Practitioner

## 2020-05-08 ENCOUNTER — Other Ambulatory Visit (HOSPITAL_COMMUNITY): Payer: Self-pay | Admitting: Nurse Practitioner

## 2020-06-06 ENCOUNTER — Other Ambulatory Visit (HOSPITAL_COMMUNITY): Payer: Self-pay | Admitting: Internal Medicine

## 2020-06-06 MED FILL — FLUARIX QUADRIVALENT 0.5 ML: 0.5 | 1 days supply | Qty: 1 | Fill #0

## 2020-06-15 ENCOUNTER — Ambulatory Visit (HOSPITAL_COMMUNITY)
Admission: EM | Admit: 2020-06-15 | Discharge: 2020-06-15 | Disposition: A | Discharge status: Home / Self Care | Payer: No Typology Code available for payment source | Attending: Family Medicine | Admitting: Family Medicine

## 2020-06-15 ENCOUNTER — Ambulatory Visit (INDEPENDENT_AMBULATORY_CARE_PROVIDER_SITE_OTHER): Payer: No Typology Code available for payment source

## 2020-06-15 ENCOUNTER — Other Ambulatory Visit: Payer: Self-pay

## 2020-06-15 ENCOUNTER — Other Ambulatory Visit (HOSPITAL_COMMUNITY): Payer: Self-pay | Admitting: Family Medicine

## 2020-06-15 ENCOUNTER — Encounter (HOSPITAL_COMMUNITY): Payer: Self-pay

## 2020-06-15 DIAGNOSIS — M7732 Calcaneal spur, left foot: Secondary | ICD-10-CM

## 2020-06-15 DIAGNOSIS — M25572 Pain in left ankle and joints of left foot: Secondary | ICD-10-CM

## 2020-06-15 MED ORDER — PREDNISONE 20 MG PO TABS: 40.0000 mg | ORAL_TABLET | Freq: Every day | ORAL | 0 refills | Status: DC

## 2020-06-15 MED ORDER — KETOROLAC TROMETHAMINE 60 MG/2ML IM SOLN
INTRAMUSCULAR | Status: AC
  Filled 2020-06-15: qty 2

## 2020-06-15 MED ORDER — KETOROLAC TROMETHAMINE 60 MG/2ML IM SOLN
60.0000 mg | Freq: Once | INTRAMUSCULAR | Status: AC
  Administered 2020-06-15: 60 mg via INTRAMUSCULAR

## 2020-06-15 NOTE — ED Provider Notes (Signed)
[facility]    CSN: [id number] Arrival date & time: 06/15/20  1203      History   Chief Complaint Chief Complaint  Patient presents with  . Ankle Pain    left  . Foot Pain    left    HPI Bill Ferrell is a 54 y.o. male.   Patient presenting today with 4 month hx of left medial ankle, heel and foot pain progressively worsening. Some swelling to the area, no noticed discoloration. Denies any injury prior to onset or known ankle/foot issues. Has tried aspirin without significant benefit and also tried some new shoes. Does have a hx of gout, has taken colchicine prn for this. No recent flares he's aware of.      Past Medical History:  Diagnosis Date  . Gout   . Hypertension     Patient Active Problem List   Diagnosis Date Noted  . Alcohol dependence with uncomplicated withdrawal (HCC) 10/08/2014  . Substance induced mood disorder (HCC) 10/08/2014  . Cocaine abuse (HCC) 10/08/2014    History reviewed. No pertinent surgical history.     Home Medications    Prior to Admission medications   Medication Sig Start Date End Date Taking? Authorizing Provider  amLODipine (NORVASC) 10 MG tablet Take 10 mg by mouth daily. 03/04/19   [provider]  benzonatate (TESSALON) 100 MG capsule Take 1 capsule (100 mg total) by mouth every 8 (eight) hours. Patient not taking: Reported on 11/23/2015 12/20/14   [name], PA-C  colchicine 0.6 MG tablet Take 0.6 mg by mouth daily.    [provider]  cyclobenzaprine (FLEXERIL) 10 MG tablet Take 1 tablet (10 mg total) by mouth at bedtime. 07/31/17   Wieters, Hallie C, PA-C  doxycycline (VIBRAMYCIN) 100 MG capsule Take 1 capsule (100 mg total) by mouth 2 (two) times daily. Patient not taking: Reported on 11/23/2015 09/26/15   [name], MD  lisinopril (PRINIVIL,ZESTRIL) 10 MG tablet Take 10 mg by mouth daily.    [provider]  loratadine (CLARITIN) 10 MG tablet Take 10 mg by mouth daily as needed  for allergies.    [provider]  meloxicam (MOBIC) 7.5 MG tablet Take 7.5 mg by mouth daily. 04/01/19   [provider]  methocarbamol (ROBAXIN) 500 MG tablet Take 2 tablets (1,000 mg total) by mouth 4 (four) times daily. 12/20/14   [name], PA-C  metoprolol tartrate (LOPRESSOR) 25 MG tablet Take 25 mg by mouth 2 (two) times daily. 04/10/19   [provider]  Multiple Vitamins-Minerals (MULTIVITAMIN WITH MINERALS) tablet Take 1 tablet by mouth daily.    [provider]  nabumetone (RELAFEN) 750 MG tablet Take 750 mg by mouth every 12 (twelve) hours. 04/26/19   [provider]  naproxen (NAPROSYN) 500 MG tablet Take 1 tablet (500 mg total) by mouth 2 (two) times daily. 07/31/17   Wieters, Hallie C, PA-C  predniSONE (DELTASONE) 20 MG tablet Take 2 tablets (40 mg total) by mouth daily with breakfast. 06/15/20   Particia Nearing, PA-C  QUEtiapine (SEROQUEL) 100 MG tablet TAKE 1 TABLET BY MOUTH EVERYDAY AT BEDTIME 02/11/19   [provider]  sertraline (ZOLOFT) 50 MG tablet Take 50 mg by mouth daily. 04/01/19   [provider]  simethicone (MYLICON) 80 MG chewable tablet Chew 160 mg by mouth every 6 (six) hours as needed for flatulence.    [provider]  traZODone (DESYREL) 50 MG tablet TAKE 1 TABLET BY  MOUTH EVERYDAY AT BEDTIME 04/06/19   [provider]    Family History History reviewed. No pertinent family history.  Social History Social History   Tobacco Use  . Smoking status: Current Every Day Smoker  . Smokeless tobacco: Never Used  Substance Use Topics  . Alcohol use: Yes  . Drug use: Not on file     Allergies   Patient has no known allergies.   Review of Systems Review of Systems PER HPI    Physical Exam Triage Vital Signs ED Triage Vitals  Enc Vitals Group     BP 06/15/20 1241 137/86     Pulse Rate 06/15/20 1241 63     Resp 06/15/20 1241 18     Temp 06/15/20 1241 97.8 F (36.6 C)      Temp Source 06/15/20 1241 Oral     SpO2 06/15/20 1241 98 %     Weight --      Height --      Head Circumference --      Peak Flow --      Pain Score 06/15/20 1238 5     Pain Loc --      Pain Edu? --      Excl. in GC? --    No data found.  Updated Vital Signs BP 137/86 (BP Location: Right Arm)   Pulse 63   Temp 97.8 F (36.6 C) (Oral)   Resp 18   SpO2 98%   Visual Acuity Right Eye Distance:   Left Eye Distance:   Bilateral Distance:    Right Eye Near:   Left Eye Near:    Bilateral Near:     Physical Exam Vitals and nursing note reviewed.  Constitutional:      Appearance: Normal appearance.  HENT:     Head: Atraumatic.  Eyes:     Extraocular Movements: Extraocular movements intact.     Conjunctiva/sclera: Conjunctivae normal.  Cardiovascular:     Rate and Rhythm: Normal rate and regular rhythm.     Pulses: Normal pulses.  Pulmonary:     Effort: Pulmonary effort is normal.     Breath sounds: Normal breath sounds.  Abdominal:     Palpations: Abdomen is soft.  Musculoskeletal:        General: Swelling (medial left akle edematous down into plantar surface of foot posteriorly) and tenderness present. No deformity or signs of injury. Normal range of motion.     Cervical back: Normal range of motion and neck supple.  Skin:    General: Skin is warm and dry.     Findings: No bruising or erythema.  Neurological:     General: No focal deficit present.     Mental Status: He is oriented to person, place, and time.     Sensory: No sensory deficit.     Motor: No weakness.     Coordination: Coordination normal.     Gait: Gait abnormal (antalgic due to left foot pain).  Psychiatric:        Mood and Affect: Mood normal.        Thought Content: Thought content normal.        Judgment: Judgment normal.    UC Treatments / Results  Labs (all labs ordered are listed, but only abnormal results are displayed) Labs Reviewed - No data to display  EKG   Radiology DG  Ankle Complete Left  Result Date: 06/15/2020 CLINICAL DATA:  Left ankle and heel pain for 4 months. EXAM: LEFT ANKLE  COMPLETE - 3+ VIEW COMPARISON:  None. FINDINGS: Degenerative changes are noted in the medial malleolus. Ankle joint is intact. No significant effusion is present. Plantar calcaneal spurs are noted. Degenerative changes are present in the midfoot. IMPRESSION: 1. Degenerative changes in the medial malleolus and midfoot. 2. No acute abnormality. Electronically Signed   By: Marin Roberts M.D.   On: 06/15/2020 14:22   DG Os Calcis Left  Result Date: 06/15/2020 CLINICAL DATA:  Heel pain for 4 months. EXAM: LEFT OS CALCIS - 2+ VIEW COMPARISON:  None. FINDINGS: Prominent plantar calcaneal spur is noted. No acute abnormalities are evident. IMPRESSION: 1. Prominent plantar calcaneal spur. 2. No acute abnormality. Electronically Signed   By: Marin Roberts M.D.   On: 06/15/2020 14:23    Procedures Procedures (including critical care time)  Medications Ordered in UC Medications  ketorolac (TORADOL) injection 60 mg (60 mg Intramuscular Given 06/15/20 1320)    Initial Impression / Assessment and Plan / UC Course  I have reviewed the triage vital signs and the nursing notes.  Pertinent labs & imaging results that were available during my care of the patient were reviewed by me and considered in my medical decision making (see chart for details).     Calcaneal spur seen on x-ray left foot, this is likely the cause of his pain. IM toradol given, prednisone burst sent to pharmacy. Podiatry info placed into AVS and pt instructed to call for f/u as he is very interested in intervention given his pain levels with this. Work note given for today.   Final Clinical Impressions(s) / UC Diagnoses   Final diagnoses:  Calcaneal spur of left foot     Discharge Instructions     [facility]  [address], [place], [place] [address]  [phone]    ED Prescriptions     Medication Sig Dispense Auth. Provider   predniSONE (DELTASONE) 20 MG tablet Take 2 tablets (40 mg total) by mouth daily with breakfast. 10 tablet Particia Nearing, [place]     PDMP not reviewed this encounter.   Particia Nearing, [place] 06/15/20 1439

## 2020-06-15 NOTE — Discharge Instructions (Signed)
[facility] [address], [place], [place] [address] [phone] 

## 2020-06-15 NOTE — ED Triage Notes (Signed)
Pt c/o ankle and heel swelling as well as pain x 4 months. He states over time it has been getting worse. He states that both are swollen and it sharp pain. He states he can barely apply pressure. He denies knowing how it became swollen and why he is in pain.

## 2020-06-25 ENCOUNTER — Other Ambulatory Visit: Payer: Self-pay

## 2020-06-25 ENCOUNTER — Encounter: Payer: Self-pay | Admitting: Podiatry

## 2020-06-25 ENCOUNTER — Ambulatory Visit (INDEPENDENT_AMBULATORY_CARE_PROVIDER_SITE_OTHER): Payer: No Typology Code available for payment source | Admitting: Podiatry

## 2020-06-25 ENCOUNTER — Other Ambulatory Visit: Payer: Self-pay | Admitting: Podiatry

## 2020-06-25 DIAGNOSIS — M722 Plantar fascial fibromatosis: Secondary | ICD-10-CM | POA: Diagnosis not present

## 2020-06-25 MED ORDER — DICLOFENAC SODIUM 75 MG PO TBEC: 75.0000 mg | DELAYED_RELEASE_TABLET | Freq: Two times a day (BID) | ORAL | 1 refills | Status: DC

## 2020-06-25 MED ORDER — METHYLPREDNISOLONE 4 MG PO TBPK: ORAL_TABLET | ORAL | 0 refills | Status: DC

## 2020-06-25 NOTE — Progress Notes (Signed)
   Subjective: 54 y.o. male presenting for evaluation of left heel pain this been going on approximately 12-14 months now.  At one point he went to the urgent care where x-rays were taken he was diagnosed with a plantar heel spur.  He presents for further treatment evaluation   Past Medical History:  Diagnosis Date  . Gout   . Hypertension      Objective: Physical Exam General: The patient is alert and oriented x3 in no acute distress.  Dermatology: Skin is warm, dry and supple bilateral lower extremities. Negative for open lesions or macerations bilateral.   Vascular: Dorsalis Pedis and Posterior Tibial pulses palpable bilateral.  Capillary fill time is immediate to all digits.  Neurological: Epicritic and protective threshold intact bilateral.   Musculoskeletal: Tenderness to palpation to the plantar aspect of the left heel along the plantar fascia. All other joints range of motion within normal limits bilateral. Strength 5/5 in all groups bilateral.   Radiographic exam taken 06/15/2020 at urgent care: Plantar heel spur noted.  Otherwise normal exam Assessment: 1. Plantar fasciitis left foot  Plan of Care:  1. Patient evaluated. Xrays reviewed.   2. Injection of 0.5cc Celestone soluspan injected into the left plantar fascia.  3. Rx for Medrol Dose Pak placed 4. Rx for Meloxicam ordered for patient. 5.  OTC power step insoles provided and dispensed today  6. Instructed patient regarding therapies and modalities at home to alleviate symptoms.  7. Return to clinic in 4 weeks.    *Works in the kitchen at [employer]  Felecia Shelling, DPM [facility]  Dr. Felecia Shelling, DPM    [address] [place], [place] [address]                Office [phone]  Fax [phone]

## 2020-07-23 ENCOUNTER — Encounter: Payer: Self-pay | Admitting: Podiatry

## 2020-07-23 ENCOUNTER — Ambulatory Visit (INDEPENDENT_AMBULATORY_CARE_PROVIDER_SITE_OTHER): Payer: No Typology Code available for payment source | Admitting: Podiatry

## 2020-07-23 ENCOUNTER — Other Ambulatory Visit: Payer: Self-pay

## 2020-07-23 DIAGNOSIS — M722 Plantar fascial fibromatosis: Secondary | ICD-10-CM

## 2020-07-23 DIAGNOSIS — M7732 Calcaneal spur, left foot: Secondary | ICD-10-CM

## 2020-07-23 NOTE — Progress Notes (Signed)
   Subjective: 54 y.o. male presenting for left plantar fasciitis.  Patient states he is doing a lot better.  He is about 60 to 70% improved after the steroid injection.  He states he tolerated the Medrol Dosepak as well.  He would like to discuss future treatment options.  Past Medical History:  Diagnosis Date  . Gout   . Hypertension      Objective: Physical Exam General: The patient is alert and oriented x3 in no acute distress.  Dermatology: Skin is warm, dry and supple bilateral lower extremities. Negative for open lesions or macerations bilateral.   Vascular: Dorsalis Pedis and Posterior Tibial pulses palpable bilateral.  Capillary fill time is immediate to all digits.  Neurological: Epicritic and protective threshold intact bilateral.   Musculoskeletal: Tenderness to palpation to the plantar aspect of the left heel along the plantar fascia. All other joints range of motion within normal limits bilateral. Strength 5/5 in all groups bilateral.   Radiographic exam taken 06/15/2020 at urgent care: Plantar heel spur noted.  Otherwise normal exam Assessment: 1. Plantar fasciitis left foot  Plan of Care:  - Patient evaluated.   - Injection of 1 cc of Kenalog 10 soluspan injected into the left plantar fascia.  -Plantar fascial brace was dispensed -  Patient obtain power step and has been utilizing them without acute issues - Instructed patient regarding therapies and modalities at home to alleviate symptoms.   -Return to clinic in 4 weeks to see Dr. 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

## 2020-08-01 ENCOUNTER — Ambulatory Visit
Admission: RE | Admit: 2020-08-01 | Discharge: 2020-08-01 | Disposition: A | Discharge status: Home / Self Care | Payer: No Typology Code available for payment source | Source: Ambulatory Visit | Attending: Internal Medicine | Admitting: Internal Medicine

## 2020-08-01 ENCOUNTER — Other Ambulatory Visit: Payer: Self-pay | Admitting: Internal Medicine

## 2020-08-01 ENCOUNTER — Other Ambulatory Visit (HOSPITAL_COMMUNITY): Payer: Self-pay | Admitting: Internal Medicine

## 2020-08-01 DIAGNOSIS — R059 Cough, unspecified: Secondary | ICD-10-CM

## 2020-08-03 ENCOUNTER — Other Ambulatory Visit: Payer: Self-pay | Admitting: Internal Medicine

## 2020-08-03 ENCOUNTER — Ambulatory Visit
Admission: RE | Admit: 2020-08-03 | Discharge: 2020-08-03 | Disposition: A | Discharge status: Home / Self Care | Payer: No Typology Code available for payment source | Source: Ambulatory Visit | Attending: Internal Medicine | Admitting: Internal Medicine

## 2020-08-03 DIAGNOSIS — R059 Cough, unspecified: Secondary | ICD-10-CM

## 2020-08-24 ENCOUNTER — Other Ambulatory Visit (HOSPITAL_COMMUNITY): Payer: Self-pay | Admitting: Internal Medicine

## 2020-08-29 ENCOUNTER — Ambulatory Visit: Payer: No Typology Code available for payment source | Attending: Internal Medicine

## 2020-08-29 ENCOUNTER — Other Ambulatory Visit (HOSPITAL_COMMUNITY): Payer: Self-pay | Admitting: Internal Medicine

## 2020-08-29 DIAGNOSIS — Z23 Encounter for immunization: Secondary | ICD-10-CM

## 2020-08-29 NOTE — Progress Notes (Signed)
   Covid-19 Vaccination Clinic  Name:  HOKE BAER    MRN: [id number] DOB: October 03, 1965  08/29/2020  Mr. Hedgepath was observed post Covid-19 immunization for 15 minutes without incident. He was provided with Vaccine Information Sheet and instruction to access the V-Safe system.   Mr. Champeau was instructed to call 911 with any severe reactions post vaccine: [name] Difficulty breathing  . Swelling of face and throat  . A fast heartbeat  . A bad rash all over body  . Dizziness and weakness   Immunizations Administered    Name Date Dose VIS Date Route   PFIZER Comrnaty(Gray TOP) Covid-19 Vaccine 08/29/2020 11:07 AM 0.3 mL 07/05/2020 Intramuscular   Manufacturer: [employer], [employer]   Lot: [id number]   NDC: [phone]

## 2020-09-12 ENCOUNTER — Other Ambulatory Visit (HOSPITAL_COMMUNITY): Payer: Self-pay | Admitting: Internal Medicine

## 2020-10-05 ENCOUNTER — Other Ambulatory Visit (HOSPITAL_COMMUNITY): Payer: Self-pay | Admitting: Dentistry

## 2020-10-17 ENCOUNTER — Encounter: Payer: Self-pay | Admitting: Podiatry

## 2020-10-17 ENCOUNTER — Ambulatory Visit (INDEPENDENT_AMBULATORY_CARE_PROVIDER_SITE_OTHER): Payer: No Typology Code available for payment source | Admitting: Podiatry

## 2020-10-17 ENCOUNTER — Other Ambulatory Visit: Payer: Self-pay

## 2020-10-17 DIAGNOSIS — M722 Plantar fascial fibromatosis: Secondary | ICD-10-CM

## 2020-10-17 DIAGNOSIS — Q666 Other congenital valgus deformities of feet: Secondary | ICD-10-CM

## 2020-10-18 ENCOUNTER — Encounter: Payer: Self-pay | Admitting: Podiatry

## 2020-10-18 NOTE — Progress Notes (Signed)
Subjective:  Patient ID: Bill Ferrell, male    DOB: 09-18-1965,  MRN: [id number]  Chief Complaint  Patient presents with  . Plantar Fasciitis    Left heel pain PT stated that he would like another injection     55 y.o. male presents with the above complaint.  Patient presents with a complaint of left heel pain.  Patient states that the injection helped for many months.  He states that the pain came back again over the last few days has progressively gotten worse.  He has not obtained any orthotics or made shoe gear modification.  He would like to know that he could get another injection in the heel as it does seem to help in the past.  He already has a brace that he has been wearing actively.  He denies any other acute complaints.   Review of Systems: Negative except as noted in the HPI. Denies N/V/F/Ch.  Past Medical History:  Diagnosis Date  . Gout   . Hypertension     Current Outpatient Medications:  .  amLODipine (NORVASC) 10 MG tablet, Take 10 mg by mouth daily., Disp: , Rfl:  .  benzonatate (TESSALON) 100 MG capsule, Take 1 capsule (100 mg total) by mouth every 8 (eight) hours. (Patient not taking: Reported on 11/23/2015), Disp: 15 capsule, Rfl: 0 .  colchicine 0.6 MG tablet, Take 0.6 mg by mouth daily., Disp: , Rfl:  .  cyclobenzaprine (FLEXERIL) 10 MG tablet, Take 1 tablet (10 mg total) by mouth at bedtime., Disp: 14 tablet, Rfl: 0 .  diclofenac (VOLTAREN) 75 MG EC tablet, Take 1 tablet (75 mg total) by mouth 2 (two) times daily., Disp: 60 tablet, Rfl: 1 .  doxycycline (VIBRAMYCIN) 100 MG capsule, Take 1 capsule (100 mg total) by mouth 2 (two) times daily. (Patient not taking: Reported on 11/23/2015), Disp: 20 capsule, Rfl: 0 .  gabapentin (NEURONTIN) 300 MG capsule, Take 300 mg by mouth 3 (three) times daily., Disp: , Rfl:  .  lisinopril (PRINIVIL,ZESTRIL) 10 MG tablet, Take 10 mg by mouth daily., Disp: , Rfl:  .  loratadine (CLARITIN) 10 MG tablet, Take 10 mg by mouth daily as  needed for allergies., Disp: , Rfl:  .  meloxicam (MOBIC) 7.5 MG tablet, Take 7.5 mg by mouth daily., Disp: , Rfl:  .  methocarbamol (ROBAXIN) 500 MG tablet, Take 2 tablets (1,000 mg total) by mouth 4 (four) times daily., Disp: 20 tablet, Rfl: 0 .  methylPREDNISolone (MEDROL DOSEPAK) 4 MG TBPK tablet, 6 day dose pack - take as directed, Disp: 21 tablet, Rfl: 0 .  metoprolol tartrate (LOPRESSOR) 25 MG tablet, Take 25 mg by mouth 2 (two) times daily., Disp: , Rfl:  .  Multiple Vitamins-Minerals (MULTIVITAMIN WITH MINERALS) tablet, Take 1 tablet by mouth daily., Disp: , Rfl:  .  nabumetone (RELAFEN) 750 MG tablet, Take 750 mg by mouth every 12 (twelve) hours., Disp: , Rfl:  .  naproxen (NAPROSYN) 500 MG tablet, Take 1 tablet (500 mg total) by mouth 2 (two) times daily., Disp: 30 tablet, Rfl: 0 .  predniSONE (DELTASONE) 20 MG tablet, Take 2 tablets (40 mg total) by mouth daily with breakfast., Disp: 10 tablet, Rfl: 0 .  QUEtiapine (SEROQUEL) 100 MG tablet, TAKE 1 TABLET BY MOUTH EVERYDAY AT BEDTIME, Disp: , Rfl:  .  sertraline (ZOLOFT) 50 MG tablet, Take 50 mg by mouth daily., Disp: , Rfl:  .  simethicone (MYLICON) 80 MG chewable tablet, Chew 160 mg by mouth every  6 (six) hours as needed for flatulence., Disp: , Rfl:  .  traZODone (DESYREL) 50 MG tablet, TAKE 1 TABLET BY MOUTH EVERYDAY AT BEDTIME, Disp: , Rfl:  .  vitamin B-12 (CYANOCOBALAMIN) 100 MCG tablet, , Disp: , Rfl:   Social History   Tobacco Use  Smoking Status Current Every Day Smoker  Smokeless Tobacco Never Used    No Known Allergies Objective:  There were no vitals filed for this visit. There is no height or weight on file to calculate BMI. Constitutional Well developed. Well nourished.  Vascular Dorsalis pedis pulses palpable bilaterally. Posterior tibial pulses palpable bilaterally. Capillary refill normal to all digits.  No cyanosis or clubbing noted. Pedal hair growth normal.  Neurologic Normal speech. Oriented to  person, place, and time. Epicritic sensation to light touch grossly present bilaterally.  Dermatologic Nails well groomed and normal in appearance. No open wounds. No skin lesions.  Orthopedic: Normal joint ROM without pain or crepitus bilaterally. No visible deformities. Tender to palpation at the calcaneal tuber left.  No pain with calcaneal squeeze left. Ankle ROM diminished range of motion left. Silfverskiold Test: positive left.   Radiographs: Taken and reviewed. No acute fractures or dislocations. No evidence of stress fracture.  Plantar heel spur present. Posterior heel spur present.   Assessment:   1. Plantar fasciitis, left   2. Pes planovalgus    Plan:  Patient was evaluated and treated and all questions answered.  Plantar Fasciitis, left - XR reviewed as above.  - Educated on icing and stretching. Instructions given.  - Injection delivered to the plantar fascia as below. - DME: Plantar Fascial Brace - Pharmacologic management: None  Pes planovalgus -I explained to patient the etiology of pes planovalgus and his relationship with plantar fasciitis.  I believe patient would benefit from custom-made orthotics to help control the motion support the arch of the foot take the stress away from plantar fascial.  Patient states understanding would like to proceed with custom-made orthotics. -He will be scheduled see rec for custom-made orthotics.  Procedure: Injection Tendon/Ligament Location: Left plantar fascia at the glabrous junction; medial approach. Skin Prep: alcohol Injectate: 0.5 cc 0.5% marcaine plain, 0.5 cc of 1% Lidocaine, 0.5 cc kenalog 10. Disposition: Patient tolerated procedure well. Injection site dressed with a band-aid.  No follow-ups on file.

## 2020-11-21 ENCOUNTER — Telehealth: Payer: Self-pay | Admitting: *Deleted

## 2020-11-21 NOTE — Telephone Encounter (Signed)
Patient is calling to cancel appointment 11/23/20, please reschedule.

## 2020-11-23 ENCOUNTER — Other Ambulatory Visit: Payer: No Typology Code available for payment source

## 2020-12-11 MED FILL — Amlodipine Besylate Tab 10 MG (Base Equivalent): ORAL | 90 days supply | Qty: 90 | Fill #0 | Status: AC

## 2020-12-11 MED FILL — Meloxicam Tab 7.5 MG: ORAL | 90 days supply | Qty: 90 | Fill #0 | Status: AC

## 2020-12-11 MED FILL — Quetiapine Fumarate Tab 100 MG: ORAL | 90 days supply | Qty: 90 | Fill #0 | Status: AC

## 2020-12-12 ENCOUNTER — Other Ambulatory Visit (HOSPITAL_COMMUNITY): Payer: Self-pay

## 2021-01-03 ENCOUNTER — Other Ambulatory Visit (HOSPITAL_COMMUNITY): Payer: Self-pay

## 2021-01-03 MED FILL — Metoprolol Tartrate Tab 50 MG: ORAL | 90 days supply | Qty: 180 | Fill #0 | Status: AC

## 2021-01-18 ENCOUNTER — Other Ambulatory Visit (HOSPITAL_COMMUNITY): Payer: Self-pay

## 2021-01-25 ENCOUNTER — Other Ambulatory Visit (HOSPITAL_COMMUNITY): Payer: Self-pay

## 2021-01-25 MED ORDER — TRAZODONE HCL 50 MG PO TABS
ORAL_TABLET | ORAL | 1 refills | Status: DC
  Filled 2021-01-25: qty 90, 90d supply, fill #0

## 2021-01-30 ENCOUNTER — Other Ambulatory Visit (HOSPITAL_COMMUNITY): Payer: Self-pay

## 2021-01-30 MED ORDER — TRAZODONE HCL 50 MG PO TABS
50.0000 mg | ORAL_TABLET | Freq: Every day | ORAL | 4 refills | Status: DC
  Filled 2021-01-30: qty 90, 90d supply, fill #0
  Filled 2021-05-01: qty 90, 90d supply, fill #1
  Filled 2021-08-10: qty 90, 90d supply, fill #0
  Filled 2021-11-04: qty 90, 90d supply, fill #1
  Filled 2022-01-28: qty 90, 90d supply, fill #2

## 2021-02-03 ENCOUNTER — Encounter (HOSPITAL_BASED_OUTPATIENT_CLINIC_OR_DEPARTMENT_OTHER): Payer: Self-pay | Admitting: Obstetrics and Gynecology

## 2021-02-03 ENCOUNTER — Emergency Department (HOSPITAL_BASED_OUTPATIENT_CLINIC_OR_DEPARTMENT_OTHER)
Admission: EM | Admit: 2021-02-03 | Discharge: 2021-02-03 | Disposition: A | Discharge status: Home / Self Care | Payer: Self-pay | Attending: Emergency Medicine | Admitting: Emergency Medicine

## 2021-02-03 ENCOUNTER — Other Ambulatory Visit: Payer: Self-pay

## 2021-02-03 DIAGNOSIS — U071 COVID-19: Secondary | ICD-10-CM

## 2021-02-03 DIAGNOSIS — F17200 Nicotine dependence, unspecified, uncomplicated: Secondary | ICD-10-CM | POA: Insufficient documentation

## 2021-02-03 DIAGNOSIS — I10 Essential (primary) hypertension: Secondary | ICD-10-CM | POA: Insufficient documentation

## 2021-02-03 DIAGNOSIS — Z79899 Other long term (current) drug therapy: Secondary | ICD-10-CM | POA: Insufficient documentation

## 2021-02-03 LAB — RESP PANEL BY RT-PCR (FLU A&B, COVID) ARPGX2
Influenza A by PCR: NEGATIVE
Influenza B by PCR: NEGATIVE
SARS Coronavirus 2 by RT PCR: POSITIVE — AB

## 2021-02-03 NOTE — ED Notes (Signed)
Dr. Freida Busman aware of positive covid

## 2021-02-03 NOTE — ED Provider Notes (Addendum)
[facility] EMERGENCY DEPT Provider Note   CSN: [id number] Arrival date & time: 02/03/21  1833     History Chief Complaint  Patient presents with   Covid Positive    Bill Ferrell is a 55 y.o. male.  55 year old male here requesting a COVID test.  Patient states he tested positive x2 at home.  Needs a work note for his job so he can have time off.  Has had mild cough but denies any dyspnea.  No vomiting or diarrhea.  States he otherwise feels okay      Past Medical History:  Diagnosis Date   Gout    Hypertension     Patient Active Problem List   Diagnosis Date Noted   Alcohol dependence with uncomplicated withdrawal (HCC) 10/08/2014   Substance induced mood disorder (HCC) 10/08/2014   Cocaine abuse (HCC) 10/08/2014    History reviewed. No pertinent surgical history.     History reviewed. No pertinent family history.  Social History   Tobacco Use   Smoking status: Every Day    Pack years: 0.00   Smokeless tobacco: Never  Vaping Use   Vaping Use: Never used  Substance Use Topics   Alcohol use: Yes    Home Medications Prior to Admission medications   Medication Sig Start Date End Date Taking? Authorizing Provider  albuterol (VENTOLIN HFA) 108 (90 Base) MCG/ACT inhaler INHALE 2 PUFFS INTO THE LUNGS EVERY 4 HOURS AS NEEDED 30 DAYS 08/01/20 08/01/21  Renford Dills, MD  amLODipine (NORVASC) 10 MG tablet Take 10 mg by mouth daily. 03/04/19   [provider]  amLODipine (NORVASC) 10 MG tablet TAKE 1 TABLET BY MOUTH ONCE A DAY 09/12/20 09/12/21  Renford Dills, MD  amLODipine (NORVASC) 10 MG tablet TAKE 1 TABLET BY MOUTH ONCE A DAY 02/03/20 02/02/21  Dartha Lodge, FNP  benzonatate (TESSALON) 100 MG capsule Take 1 capsule (100 mg total) by mouth every 8 (eight) hours. Patient not taking: Reported on 11/23/2015 12/20/14   [name], PA-C  chlorhexidine (PERIDEX) 0.12 % solution SWISH AND SPIT 1 CAPFUL TWICE A DAY 10/05/20 10/05/21  Rorie, Coral Else R, DDS   colchicine 0.6 MG tablet Take 0.6 mg by mouth daily.    [provider]  COVID-19 mRNA vaccine, Pfizer, 30 MCG/0.3ML injection USE AS DIRECTED 08/29/20 08/29/21  Judyann Munson, MD  cyclobenzaprine (FLEXERIL) 10 MG tablet Take 1 tablet (10 mg total) by mouth at bedtime. 07/31/17   Wieters, Hallie C, PA-C  diclofenac (VOLTAREN) 75 MG EC tablet TAKE 1 TABLET (75 MG TOTAL) BY MOUTH 2 (TWO) TIMES DAILY. 06/25/20 06/25/21  Felecia Shelling, DPM  doxycycline (PERIOSTAT) 20 MG tablet TAKE 1 TABLET BY MOUTH 2 TIMES DAILY FOR 14 DAYS 10/05/20 10/05/21  Rorie, Kellie Shropshire, DDS  doxycycline (VIBRAMYCIN) 100 MG capsule Take 1 capsule (100 mg total) by mouth 2 (two) times daily. Patient not taking: Reported on 11/23/2015 09/26/15   [name], MD  gabapentin (NEURONTIN) 300 MG capsule Take 300 mg by mouth 3 (three) times daily. 02/17/20   [provider]  influenza vac split quadrivalent PF (FLUARIX) 0.5 ML injection TO BE ADMINISTERED BY PHARMACIST 06/06/20 06/06/21  Judyann Munson, MD  lisinopril (PRINIVIL,ZESTRIL) 10 MG tablet Take 10 mg by mouth daily.    [provider]  loratadine (CLARITIN) 10 MG tablet Take 10 mg by mouth daily as needed for allergies.    [provider]  meloxicam (MOBIC) 7.5 MG tablet Take 7.5 mg by mouth daily. 04/01/19  [provider]  meloxicam (MOBIC) 7.5 MG tablet TAKE 1 TABLET BY MOUTH ONCE A DAY 08/24/20 08/24/21  Renford Dills, MD  meloxicam (MOBIC) 7.5 MG tablet TAKE 1 TABLET BY MOUTH ONCE A DAY 05/08/20 05/08/21  Dartha Lodge, FNP  methocarbamol (ROBAXIN) 500 MG tablet Take 2 tablets (1,000 mg total) by mouth 4 (four) times daily. 12/20/14   [name], PA-C  methylPREDNISolone (MEDROL DOSEPAK) 4 MG TBPK tablet TAKE AS DIRECTED FOR 6 DAYS 06/25/20 06/25/21  Felecia Shelling, DPM  metoprolol tartrate (LOPRESSOR) 25 MG tablet Take 25 mg by mouth 2 (two) times daily. 04/10/19   [provider]  metoprolol tartrate (LOPRESSOR) 50 MG  tablet TAKE 1 TABLET BY MOUTH 2 TIMES A DAY 09/12/20 09/12/21  Renford Dills, MD  metoprolol tartrate (LOPRESSOR) 50 MG tablet TAKE 1 TABLET BY MOUTH TWICE DAILY 02/03/20 02/02/21  Dartha Lodge, FNP  Multiple Vitamins-Minerals (MULTIVITAMIN WITH MINERALS) tablet Take 1 tablet by mouth daily.    [provider]  nabumetone (RELAFEN) 750 MG tablet Take 750 mg by mouth every 12 (twelve) hours. 04/26/19   [provider]  naproxen (NAPROSYN) 500 MG tablet Take 1 tablet (500 mg total) by mouth 2 (two) times daily. 07/31/17   Wieters, Hallie C, PA-C  predniSONE (DELTASONE) 10 MG tablet TAKE 6 TABS BY MOUTH ON DAY 1; 5 TABS ON DAY 2; 4 TABS ON DAY 3; 3 TABS ON DAY 4; 2 TABS ON DAY 5; 1 TAB ON DAY 6 THEN STOP 08/01/20 08/01/21  Renford Dills, MD  predniSONE (DELTASONE) 20 MG tablet TAKE 2 TABLETS (40 MG TOTAL) BY MOUTH DAILY WITH BREAKFAST. 06/15/20 06/15/21  Particia Nearing, PA-C  QUEtiapine (SEROQUEL) 100 MG tablet TAKE 1 TABLET BY MOUTH EVERYDAY AT BEDTIME 02/11/19   [provider]  QUEtiapine (SEROQUEL) 100 MG tablet TAKE 1 TABLET BY MOUTH ONCE A DAY AT BEDTIME 09/12/20 09/12/21  Renford Dills, MD  QUEtiapine (SEROQUEL) 100 MG tablet TAKE 1 TABLET BY MOUTH EVERY NIGHT AT BEDTIME 02/03/20 02/02/21  Dartha Lodge, FNP  sertraline (ZOLOFT) 50 MG tablet Take 50 mg by mouth daily. 04/01/19   [provider]  simethicone (MYLICON) 80 MG chewable tablet Chew 160 mg by mouth every 6 (six) hours as needed for flatulence.    [provider]  traZODone (DESYREL) 50 MG tablet TAKE 1 TABLET BY MOUTH EVERYDAY AT BEDTIME 04/06/19   [provider]  traZODone (DESYREL) 50 MG tablet TAKE 1 TABLET BY MOUTH EVERY NIGHT AT BEDTIME 02/03/20 02/02/21  Dartha Lodge, FNP  traZODone (DESYREL) 50 MG tablet TAKE 1 TABLET BY MOUTH EVERY NIGHT AT BEDTIME 11/11/19 11/10/20  Dartha Lodge, FNP  traZODone (DESYREL) 50 MG tablet Take 1 tablet by mouth once a day at bedtime as needed. 01/25/21      traZODone (DESYREL) 50 MG tablet TAKE 1 TABLET BY MOUTH EVERY NIGHT AT BEDTIME 01/30/21     vitamin B-12 (CYANOCOBALAMIN) 100 MCG tablet  03/31/20   [provider]    Allergies    Patient has no known allergies.  Review of Systems   Review of Systems  All other systems reviewed and are negative.  Physical Exam Updated Vital Signs BP (!) 162/97   Pulse 67   Temp 97.9 F (36.6 C) (Oral)   Resp 20   SpO2 100%   Physical Exam Vitals and nursing note reviewed.  Constitutional:      General: He is not in acute distress.    Appearance: Normal  appearance. He is well-developed. He is not toxic-appearing.  HENT:     Head: Normocephalic and atraumatic.  Eyes:     General: Lids are normal.     Conjunctiva/sclera: Conjunctivae normal.     Pupils: Pupils are equal, round, and reactive to light.  Neck:     Thyroid: No thyroid mass.     Trachea: No tracheal deviation.  Cardiovascular:     Rate and Rhythm: Normal rate and regular rhythm.     Heart sounds: Normal heart sounds. No murmur heard.   No gallop.  Pulmonary:     Effort: Pulmonary effort is normal. No respiratory distress.     Breath sounds: Normal breath sounds. No stridor. No decreased breath sounds, wheezing, rhonchi or rales.  Abdominal:     General: There is no distension.     Palpations: Abdomen is soft.     Tenderness: There is no abdominal tenderness. There is no rebound.  Musculoskeletal:        General: No tenderness. Normal range of motion.     Cervical back: Normal range of motion and neck supple.  Skin:    General: Skin is warm and dry.     Findings: No abrasion or rash.  Neurological:     Mental Status: He is alert and oriented to person, place, and time. Mental status is at baseline.     GCS: GCS eye subscore is 4. GCS verbal subscore is 5. GCS motor subscore is 6.     Cranial Nerves: Cranial nerves are intact. No cranial nerve deficit.     Sensory: No sensory deficit.     Motor: Motor function is  intact.  Psychiatric:        Attention and Perception: Attention normal.        Speech: Speech normal.        Behavior: Behavior normal.    ED Results / Procedures / Treatments   Labs (all labs ordered are listed, but only abnormal results are displayed) Labs Reviewed  RESP PANEL BY RT-PCR (FLU A&B, COVID) [id number]    EKG None  Radiology No results found.  Procedures Procedures   Medications Ordered in ED Medications - No data to display  ED Course  I have reviewed the triage vital signs and the nursing notes.  Pertinent labs & imaging results that were available during my care of the patient were reviewed by me and considered in my medical decision making (see chart for details).    MDM Rules/Calculators/A&P                          Patient to receive a COVID test here.  And will give work note . Final Clinical Impression(s) / ED Diagnoses Final diagnoses:  None    Rx / DC Orders ED Discharge Orders     None        Lorre Nick, MD 02/03/21 1906    Lorre Nick, MD 02/03/21 1907

## 2021-02-03 NOTE — ED Notes (Signed)
Pt verbalizes understanding of discharge instructions. Opportunity for questioning and answers were provided. Armand removed by staff, pt discharged from ED to home. Sent home with work noted.

## 2021-02-03 NOTE — Discharge Instructions (Addendum)
You are COVID-positive by PCR test in the ER.  You must quarantine for at least 5 days

## 2021-02-03 NOTE — ED Triage Notes (Signed)
Patient reports he just got back from [place] and tested COVID + at home. Needs work note

## 2021-02-08 ENCOUNTER — Emergency Department (HOSPITAL_BASED_OUTPATIENT_CLINIC_OR_DEPARTMENT_OTHER)
Admission: EM | Admit: 2021-02-08 | Discharge: 2021-02-08 | Disposition: A | Discharge status: Home / Self Care | Payer: Self-pay | Attending: Emergency Medicine | Admitting: Emergency Medicine

## 2021-02-08 ENCOUNTER — Other Ambulatory Visit: Payer: Self-pay

## 2021-02-08 ENCOUNTER — Encounter (HOSPITAL_BASED_OUTPATIENT_CLINIC_OR_DEPARTMENT_OTHER): Payer: Self-pay | Admitting: Emergency Medicine

## 2021-02-08 DIAGNOSIS — U071 COVID-19: Secondary | ICD-10-CM | POA: Insufficient documentation

## 2021-02-08 DIAGNOSIS — Z79899 Other long term (current) drug therapy: Secondary | ICD-10-CM | POA: Insufficient documentation

## 2021-02-08 DIAGNOSIS — I10 Essential (primary) hypertension: Secondary | ICD-10-CM | POA: Insufficient documentation

## 2021-02-08 DIAGNOSIS — F17200 Nicotine dependence, unspecified, uncomplicated: Secondary | ICD-10-CM | POA: Insufficient documentation

## 2021-02-08 NOTE — ED Triage Notes (Signed)
Tested positive for covid 5 days ago. States he wants to know if he can return to work and needs a note.

## 2021-02-08 NOTE — ED Provider Notes (Signed)
[facility] EMERGENCY DEPT Provider Note   CSN: [id number] Arrival date & time: 02/08/21  1114     History Chief Complaint  Patient presents with  . Need work note    Bill Ferrell is a 55 y.o. male.  Pt presents to the ED today wanting a note to go back to work.  Pt was diagnosed with Covid on 7/10.  Sx started on the 9th.  He is fully vaccinated.  He said he is feeling much better, but his work will not let him come back unless he's been cleared by a doctor.      Past Medical History:  Diagnosis Date  . Gout   . Hypertension     Patient Active Problem List   Diagnosis Date Noted  . Alcohol dependence with uncomplicated withdrawal (HCC) 10/08/2014  . Substance induced mood disorder (HCC) 10/08/2014  . Cocaine abuse (HCC) 10/08/2014    History reviewed. No pertinent surgical history.     No family history on file.  Social History   Tobacco Use  . Smoking status: Every Day  . Smokeless tobacco: Never  Vaping Use  . Vaping Use: Never used  Substance Use Topics  . Alcohol use: Yes    Home Medications Prior to Admission medications   Medication Sig Start Date End Date Taking? Authorizing Provider  albuterol (VENTOLIN HFA) 108 (90 Base) MCG/ACT inhaler INHALE 2 PUFFS INTO THE LUNGS EVERY 4 HOURS AS NEEDED 30 DAYS 08/01/20 08/01/21  Renford Dills, MD  amLODipine (NORVASC) 10 MG tablet Take 10 mg by mouth daily. 03/04/19   [provider]  amLODipine (NORVASC) 10 MG tablet TAKE 1 TABLET BY MOUTH ONCE A DAY 09/12/20 09/12/21  Renford Dills, MD  amLODipine (NORVASC) 10 MG tablet TAKE 1 TABLET BY MOUTH ONCE A DAY 02/03/20 02/02/21  Dartha Lodge, FNP  benzonatate (TESSALON) 100 MG capsule Take 1 capsule (100 mg total) by mouth every 8 (eight) hours. Patient not taking: Reported on 11/23/2015 12/20/14   [name], PA-C  chlorhexidine (PERIDEX) 0.12 % solution SWISH AND SPIT 1 CAPFUL TWICE A DAY 10/05/20 10/05/21  Rorie, Coral Else R, DDS  colchicine 0.6 MG  tablet Take 0.6 mg by mouth daily.    [provider]  COVID-19 mRNA vaccine, Pfizer, 30 MCG/0.3ML injection USE AS DIRECTED 08/29/20 08/29/21  Judyann Munson, MD  cyclobenzaprine (FLEXERIL) 10 MG tablet Take 1 tablet (10 mg total) by mouth at bedtime. 07/31/17   Wieters, Hallie C, PA-C  diclofenac (VOLTAREN) 75 MG EC tablet TAKE 1 TABLET (75 MG TOTAL) BY MOUTH 2 (TWO) TIMES DAILY. 06/25/20 06/25/21  Felecia Shelling, DPM  doxycycline (PERIOSTAT) 20 MG tablet TAKE 1 TABLET BY MOUTH 2 TIMES DAILY FOR 14 DAYS 10/05/20 10/05/21  Rorie, Kellie Shropshire, DDS  doxycycline (VIBRAMYCIN) 100 MG capsule Take 1 capsule (100 mg total) by mouth 2 (two) times daily. Patient not taking: Reported on 11/23/2015 09/26/15   [name], MD  gabapentin (NEURONTIN) 300 MG capsule Take 300 mg by mouth 3 (three) times daily. 02/17/20   [provider]  influenza vac split quadrivalent PF (FLUARIX) 0.5 ML injection TO BE ADMINISTERED BY PHARMACIST 06/06/20 06/06/21  Judyann Munson, MD  lisinopril (PRINIVIL,ZESTRIL) 10 MG tablet Take 10 mg by mouth daily.    [provider]  loratadine (CLARITIN) 10 MG tablet Take 10 mg by mouth daily as needed for allergies.    [provider]  meloxicam (MOBIC) 7.5 MG tablet Take 7.5 mg by mouth daily.  04/01/19   [provider]  meloxicam (MOBIC) 7.5 MG tablet TAKE 1 TABLET BY MOUTH ONCE A DAY 08/24/20 08/24/21  Renford Dills, MD  meloxicam (MOBIC) 7.5 MG tablet TAKE 1 TABLET BY MOUTH ONCE A DAY 05/08/20 05/08/21  Dartha Lodge, FNP  methocarbamol (ROBAXIN) 500 MG tablet Take 2 tablets (1,000 mg total) by mouth 4 (four) times daily. 12/20/14   [name], PA-C  methylPREDNISolone (MEDROL DOSEPAK) 4 MG TBPK tablet TAKE AS DIRECTED FOR 6 DAYS 06/25/20 06/25/21  Felecia Shelling, DPM  metoprolol tartrate (LOPRESSOR) 25 MG tablet Take 25 mg by mouth 2 (two) times daily. 04/10/19   [provider]  metoprolol tartrate (LOPRESSOR) 50 MG tablet TAKE 1 TABLET  BY MOUTH 2 TIMES A DAY 09/12/20 09/12/21  Renford Dills, MD  metoprolol tartrate (LOPRESSOR) 50 MG tablet TAKE 1 TABLET BY MOUTH TWICE DAILY 02/03/20 02/02/21  Dartha Lodge, FNP  Multiple Vitamins-Minerals (MULTIVITAMIN WITH MINERALS) tablet Take 1 tablet by mouth daily.    [provider]  nabumetone (RELAFEN) 750 MG tablet Take 750 mg by mouth every 12 (twelve) hours. 04/26/19   [provider]  naproxen (NAPROSYN) 500 MG tablet Take 1 tablet (500 mg total) by mouth 2 (two) times daily. 07/31/17   Wieters, Hallie C, PA-C  predniSONE (DELTASONE) 10 MG tablet TAKE 6 TABS BY MOUTH ON DAY 1; 5 TABS ON DAY 2; 4 TABS ON DAY 3; 3 TABS ON DAY 4; 2 TABS ON DAY 5; 1 TAB ON DAY 6 THEN STOP 08/01/20 08/01/21  Renford Dills, MD  predniSONE (DELTASONE) 20 MG tablet TAKE 2 TABLETS (40 MG TOTAL) BY MOUTH DAILY WITH BREAKFAST. 06/15/20 06/15/21  Particia Nearing, PA-C  QUEtiapine (SEROQUEL) 100 MG tablet TAKE 1 TABLET BY MOUTH EVERYDAY AT BEDTIME 02/11/19   [provider]  QUEtiapine (SEROQUEL) 100 MG tablet TAKE 1 TABLET BY MOUTH ONCE A DAY AT BEDTIME 09/12/20 09/12/21  Renford Dills, MD  QUEtiapine (SEROQUEL) 100 MG tablet TAKE 1 TABLET BY MOUTH EVERY NIGHT AT BEDTIME 02/03/20 02/02/21  Dartha Lodge, FNP  sertraline (ZOLOFT) 50 MG tablet Take 50 mg by mouth daily. 04/01/19   [provider]  simethicone (MYLICON) 80 MG chewable tablet Chew 160 mg by mouth every 6 (six) hours as needed for flatulence.    [provider]  traZODone (DESYREL) 50 MG tablet TAKE 1 TABLET BY MOUTH EVERYDAY AT BEDTIME 04/06/19   [provider]  traZODone (DESYREL) 50 MG tablet TAKE 1 TABLET BY MOUTH EVERY NIGHT AT BEDTIME 02/03/20 02/02/21  Dartha Lodge, FNP  traZODone (DESYREL) 50 MG tablet TAKE 1 TABLET BY MOUTH EVERY NIGHT AT BEDTIME 11/11/19 11/10/20  Dartha Lodge, FNP  traZODone (DESYREL) 50 MG tablet Take 1 tablet by mouth once a day at bedtime as needed. 01/25/21     traZODone  (DESYREL) 50 MG tablet TAKE 1 TABLET BY MOUTH EVERY NIGHT AT BEDTIME 01/30/21     vitamin B-12 (CYANOCOBALAMIN) 100 MCG tablet  03/31/20   [provider]    Allergies    Patient has no known allergies.  Review of Systems   Review of Systems  All other systems reviewed and are negative.  Physical Exam Updated Vital Signs BP (!) 146/90 (BP Location: Right Arm)   Pulse 65   Temp 97.8 F (36.6 C) (Oral)   Resp 18   Ht 6\' 2"  (1.88 m)   Wt 133.8 kg   SpO2 97%   BMI 37.88 kg/m   Physical Exam  Vitals and nursing note reviewed.  Constitutional:      Appearance: Normal appearance. He is obese.  HENT:     Head: Normocephalic and atraumatic.     Right Ear: External ear normal.     Left Ear: External ear normal.     Nose: Nose normal.     Mouth/Throat:     Mouth: Mucous membranes are moist.     Pharynx: Oropharynx is clear.  Eyes:     Extraocular Movements: Extraocular movements intact.     Conjunctiva/sclera: Conjunctivae normal.     Pupils: Pupils are equal, round, and reactive to light.  Cardiovascular:     Rate and Rhythm: Normal rate and regular rhythm.     Pulses: Normal pulses.     Heart sounds: Normal heart sounds.  Pulmonary:     Effort: Pulmonary effort is normal.     Breath sounds: Normal breath sounds.  Abdominal:     General: Abdomen is flat. Bowel sounds are normal.     Palpations: Abdomen is soft.  Musculoskeletal:        General: Normal range of motion.     Cervical back: Normal range of motion and neck supple.  Skin:    General: Skin is warm.     Capillary Refill: Capillary refill takes less than 2 seconds.  Neurological:     General: No focal deficit present.     Mental Status: He is alert and oriented to person, place, and time.  Psychiatric:        Mood and Affect: Mood normal.        Behavior: Behavior normal.    ED Results / Procedures / Treatments   Labs (all labs ordered are listed, but only abnormal results are displayed) Labs  Reviewed - No data to display  EKG None  Radiology No results found.  Procedures Procedures   Medications Ordered in ED Medications - No data to display  ED Course  I have reviewed the triage vital signs and the nursing notes.  Pertinent labs & imaging results that were available during my care of the patient were reviewed by me and considered in my medical decision making (see chart for details).    MDM Rules/Calculators/A&P                          Vitals are normal.  Sx are better.  He's been vaccinated.  So, he is given a note to return to work tomorrow.  Final Clinical Impression(s) / ED Diagnoses Final diagnoses:  COVID-19    Rx / DC Orders ED Discharge Orders     None        Jacalyn Lefevre, MD 02/08/21 1327

## 2021-03-18 ENCOUNTER — Other Ambulatory Visit (HOSPITAL_COMMUNITY): Payer: Self-pay

## 2021-03-18 MED FILL — Quetiapine Fumarate Tab 100 MG: ORAL | 90 days supply | Qty: 90 | Fill #1 | Status: AC

## 2021-04-01 ENCOUNTER — Other Ambulatory Visit (HOSPITAL_COMMUNITY): Payer: Self-pay

## 2021-04-01 MED FILL — Amlodipine Besylate Tab 10 MG (Base Equivalent): ORAL | 90 days supply | Qty: 90 | Fill #0 | Status: AC

## 2021-04-02 ENCOUNTER — Other Ambulatory Visit (HOSPITAL_COMMUNITY): Payer: Self-pay

## 2021-04-05 ENCOUNTER — Other Ambulatory Visit (HOSPITAL_COMMUNITY): Payer: Self-pay

## 2021-04-16 ENCOUNTER — Other Ambulatory Visit (HOSPITAL_COMMUNITY): Payer: Self-pay

## 2021-04-16 MED FILL — Metoprolol Tartrate Tab 50 MG: ORAL | 90 days supply | Qty: 180 | Fill #1 | Status: CN

## 2021-04-17 ENCOUNTER — Other Ambulatory Visit (HOSPITAL_COMMUNITY): Payer: Self-pay

## 2021-04-17 MED ORDER — MELOXICAM 7.5 MG PO TABS
7.5000 mg | ORAL_TABLET | Freq: Every day | ORAL | 3 refills | Status: DC
  Filled 2021-04-17 – 2021-05-01 (×2): qty 90, 90d supply, fill #0
  Filled 2021-08-02: qty 90, 90d supply, fill #1
  Filled 2021-11-08: qty 90, 90d supply, fill #2
  Filled 2022-02-08: qty 90, 90d supply, fill #3

## 2021-04-24 ENCOUNTER — Other Ambulatory Visit (HOSPITAL_COMMUNITY): Payer: Self-pay

## 2021-05-01 ENCOUNTER — Other Ambulatory Visit (HOSPITAL_COMMUNITY): Payer: Self-pay

## 2021-05-01 MED FILL — Metoprolol Tartrate Tab 50 MG: ORAL | 90 days supply | Qty: 180 | Fill #1 | Status: AC

## 2021-06-07 MED FILL — Quetiapine Fumarate Tab 100 MG: ORAL | 90 days supply | Qty: 90 | Fill #2 | Status: CN

## 2021-06-12 ENCOUNTER — Other Ambulatory Visit (HOSPITAL_COMMUNITY): Payer: Self-pay

## 2021-06-12 MED FILL — Quetiapine Fumarate Tab 100 MG: ORAL | 90 days supply | Qty: 90 | Fill #0 | Status: AC

## 2021-07-06 ENCOUNTER — Other Ambulatory Visit (HOSPITAL_COMMUNITY): Payer: Self-pay

## 2021-07-06 MED FILL — Amlodipine Besylate Tab 10 MG (Base Equivalent): ORAL | 90 days supply | Qty: 90 | Fill #1 | Status: AC

## 2021-07-10 ENCOUNTER — Other Ambulatory Visit (HOSPITAL_COMMUNITY): Payer: Self-pay

## 2021-07-21 IMAGING — DX DG ANKLE COMPLETE 3+V*L*
3 series · 3 of 3 positions shown · non-contrast
Comparison: None.

CLINICAL DATA: Left ankle and heel pain for 4 months.

EXAM:
LEFT ANKLE COMPLETE - 3+ VIEW

[ankle ap]
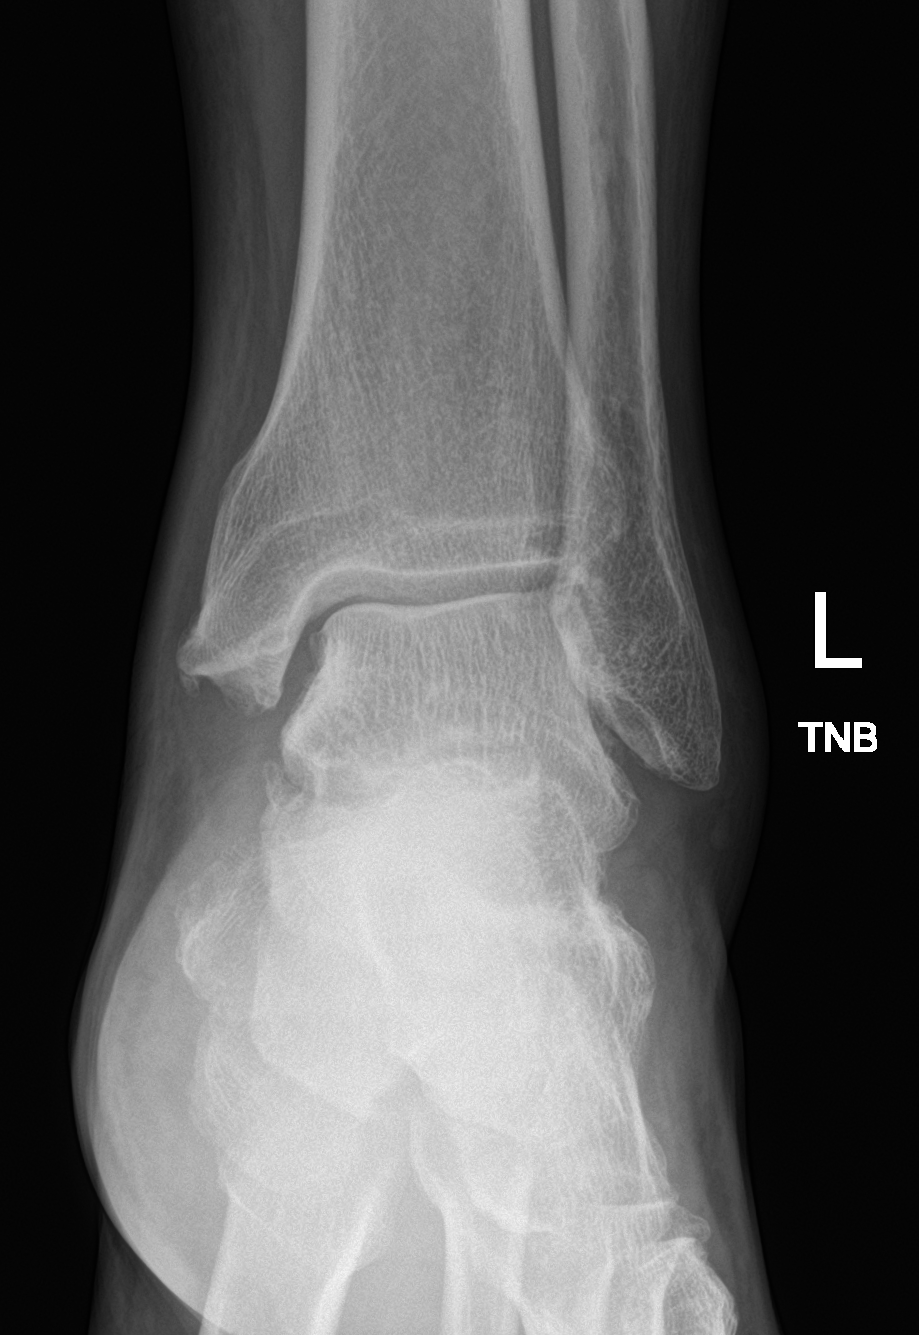

[ankle obl]
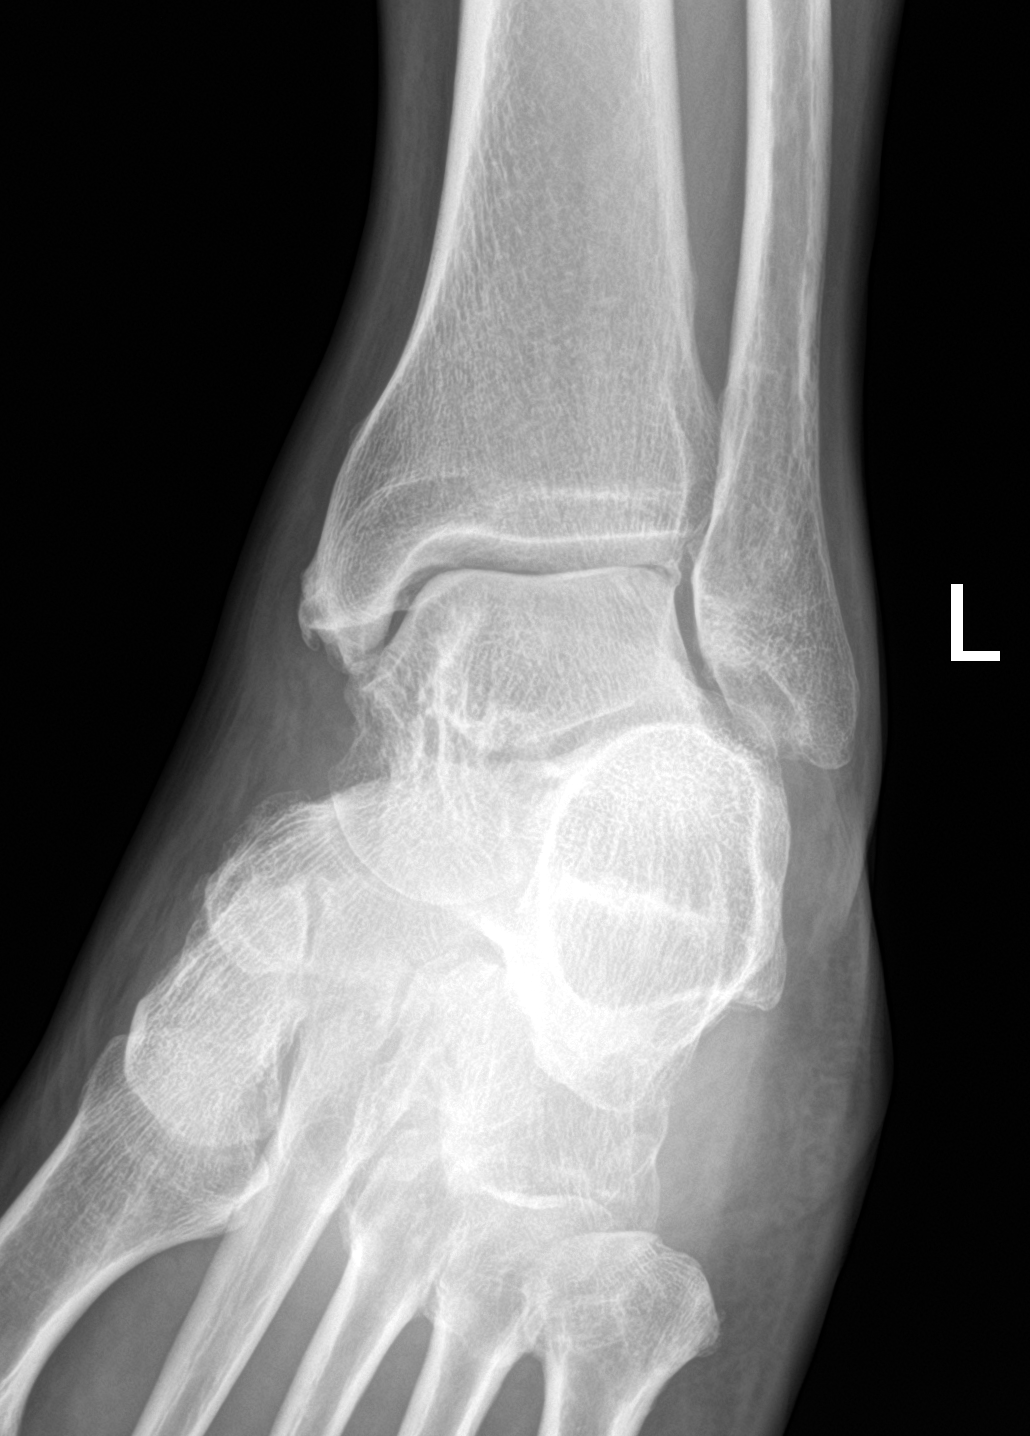

[ankle lat]
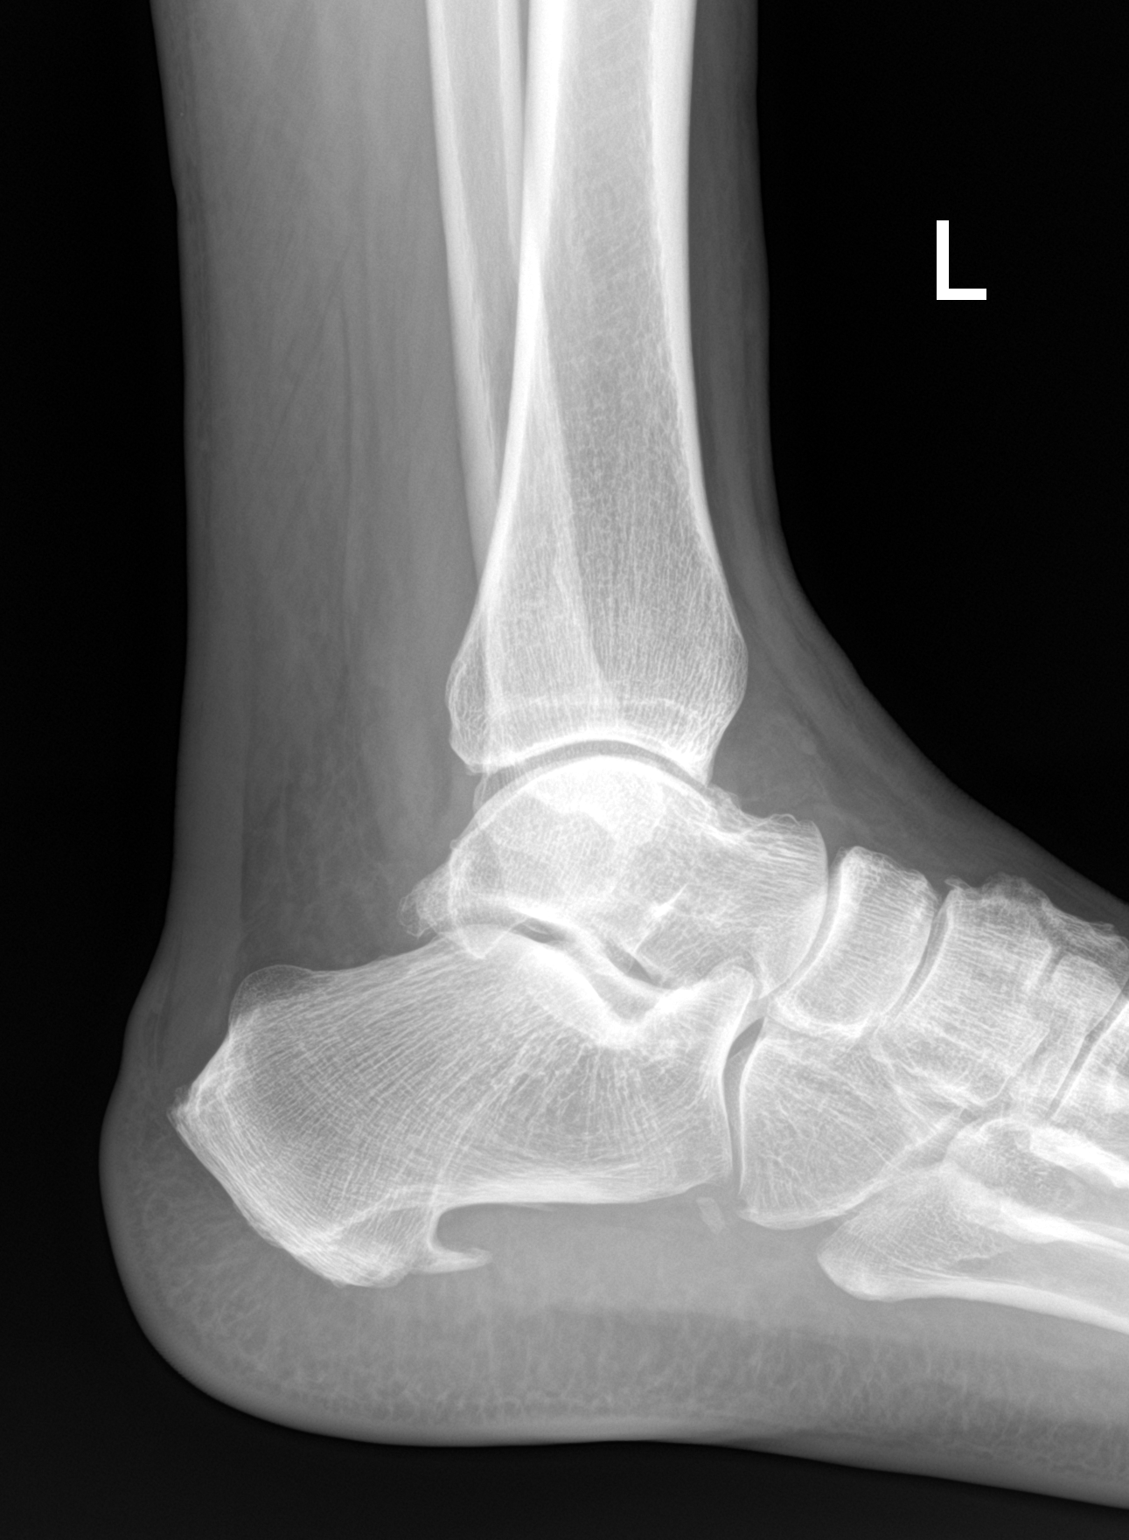

[3 of 3 positions shown; findings below may reference images not displayed]

FINDINGS: Degenerative changes are noted in the medial malleolus. Ankle joint
is intact. No significant effusion is present. Plantar calcaneal
spurs are noted. Degenerative changes are present in the midfoot.
IMPRESSION: 1. Degenerative changes in the medial malleolus and midfoot.
2. No acute abnormality.

## 2021-08-02 ENCOUNTER — Other Ambulatory Visit (HOSPITAL_COMMUNITY): Payer: Self-pay

## 2021-08-03 ENCOUNTER — Other Ambulatory Visit (HOSPITAL_COMMUNITY): Payer: Self-pay

## 2021-08-04 ENCOUNTER — Other Ambulatory Visit: Payer: Self-pay

## 2021-08-07 ENCOUNTER — Other Ambulatory Visit (HOSPITAL_COMMUNITY): Payer: Self-pay

## 2021-08-07 ENCOUNTER — Other Ambulatory Visit: Payer: Self-pay

## 2021-08-07 MED FILL — Metoprolol Tartrate Tab 50 MG: ORAL | 90 days supply | Qty: 180 | Fill #0 | Status: CN

## 2021-08-10 ENCOUNTER — Other Ambulatory Visit (HOSPITAL_COMMUNITY): Payer: Self-pay

## 2021-08-10 MED FILL — Metoprolol Tartrate Tab 50 MG: ORAL | 90 days supply | Qty: 180 | Fill #0 | Status: AC

## 2021-08-12 ENCOUNTER — Other Ambulatory Visit (HOSPITAL_COMMUNITY): Payer: Self-pay

## 2021-09-06 IMAGING — CR DG CHEST 2V
2 series · 2 of 2 positions shown · non-contrast
Comparison: 12/20/2014

CLINICAL DATA: Cough, some shortness of breath, smoker,
hypertension

EXAM:
CHEST - 2 VIEW

[w chest pa]
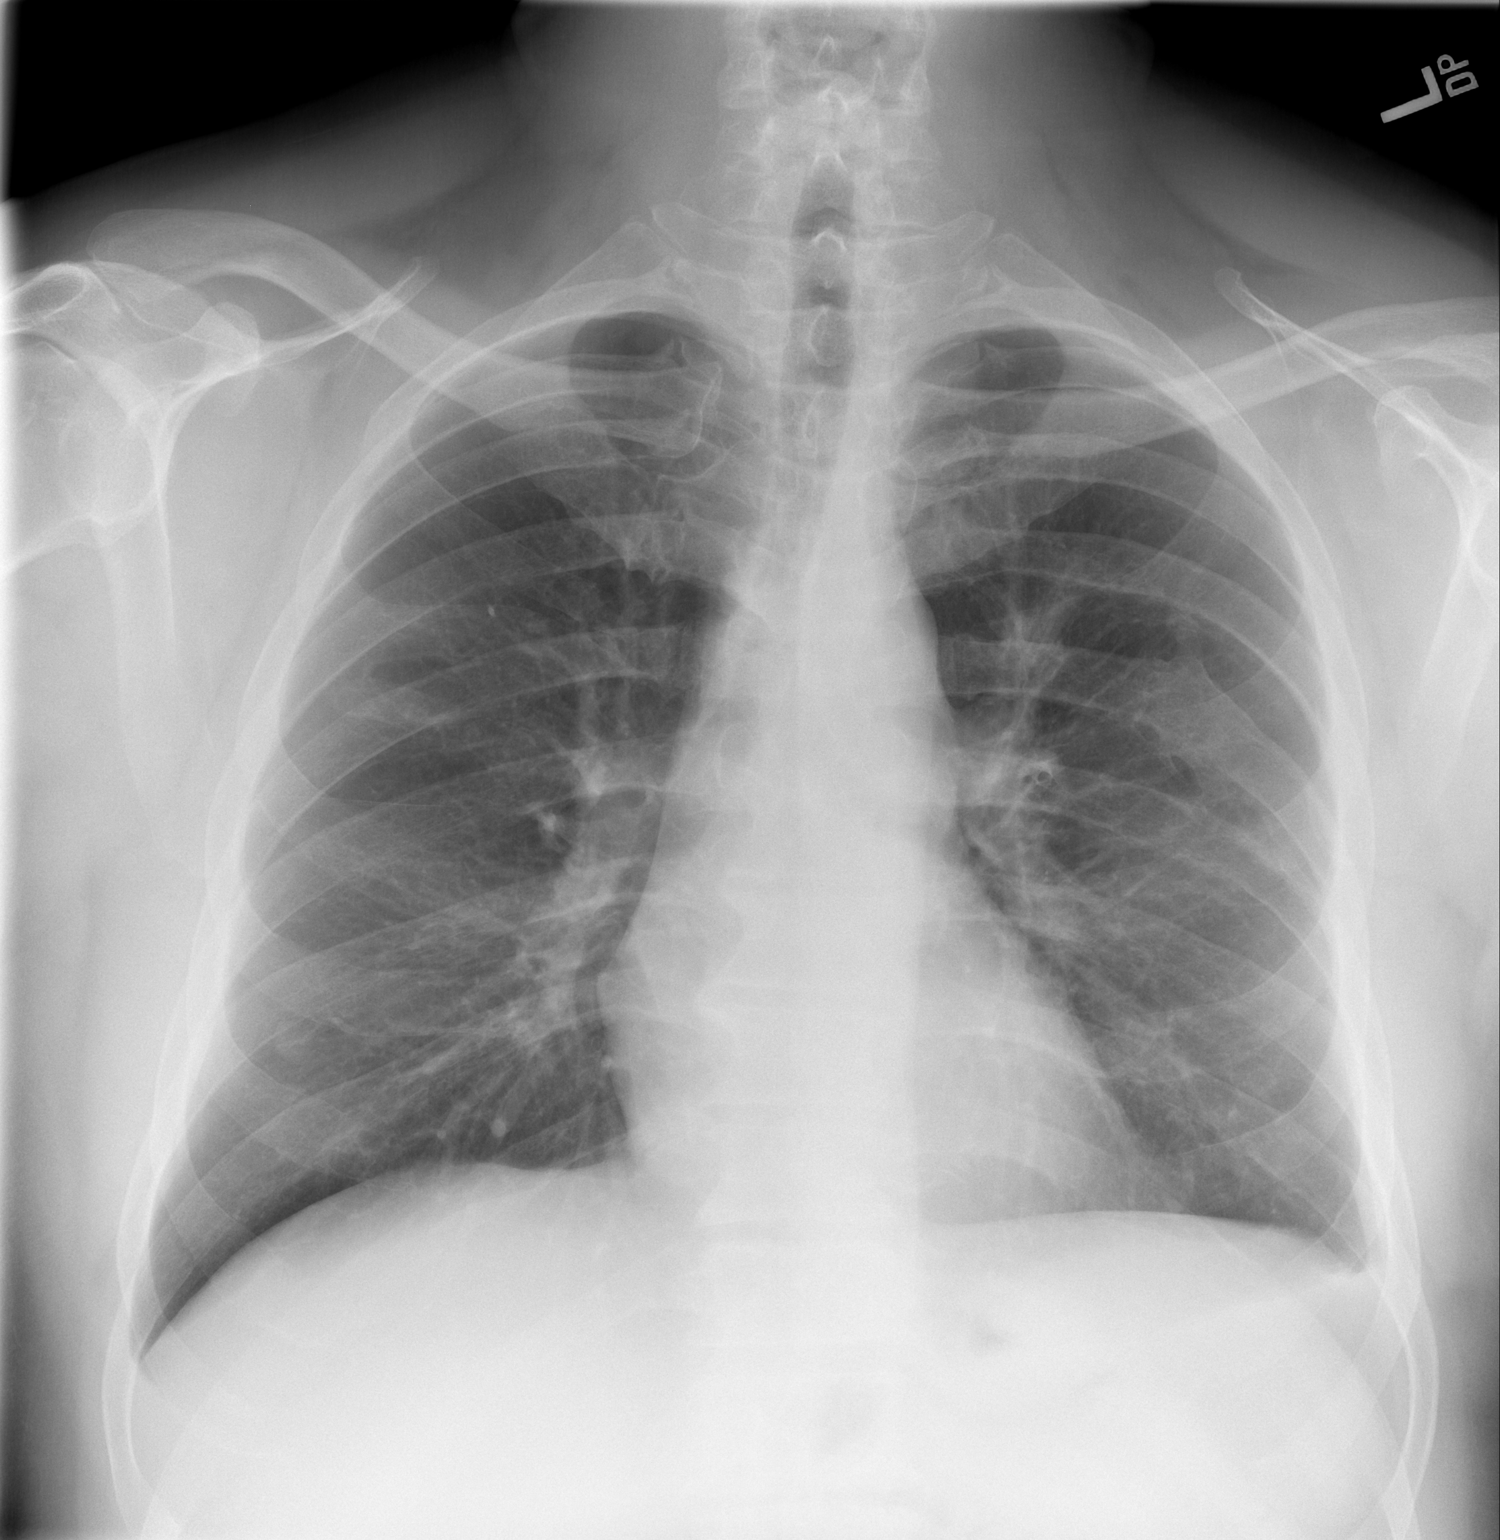

[w chest lat]
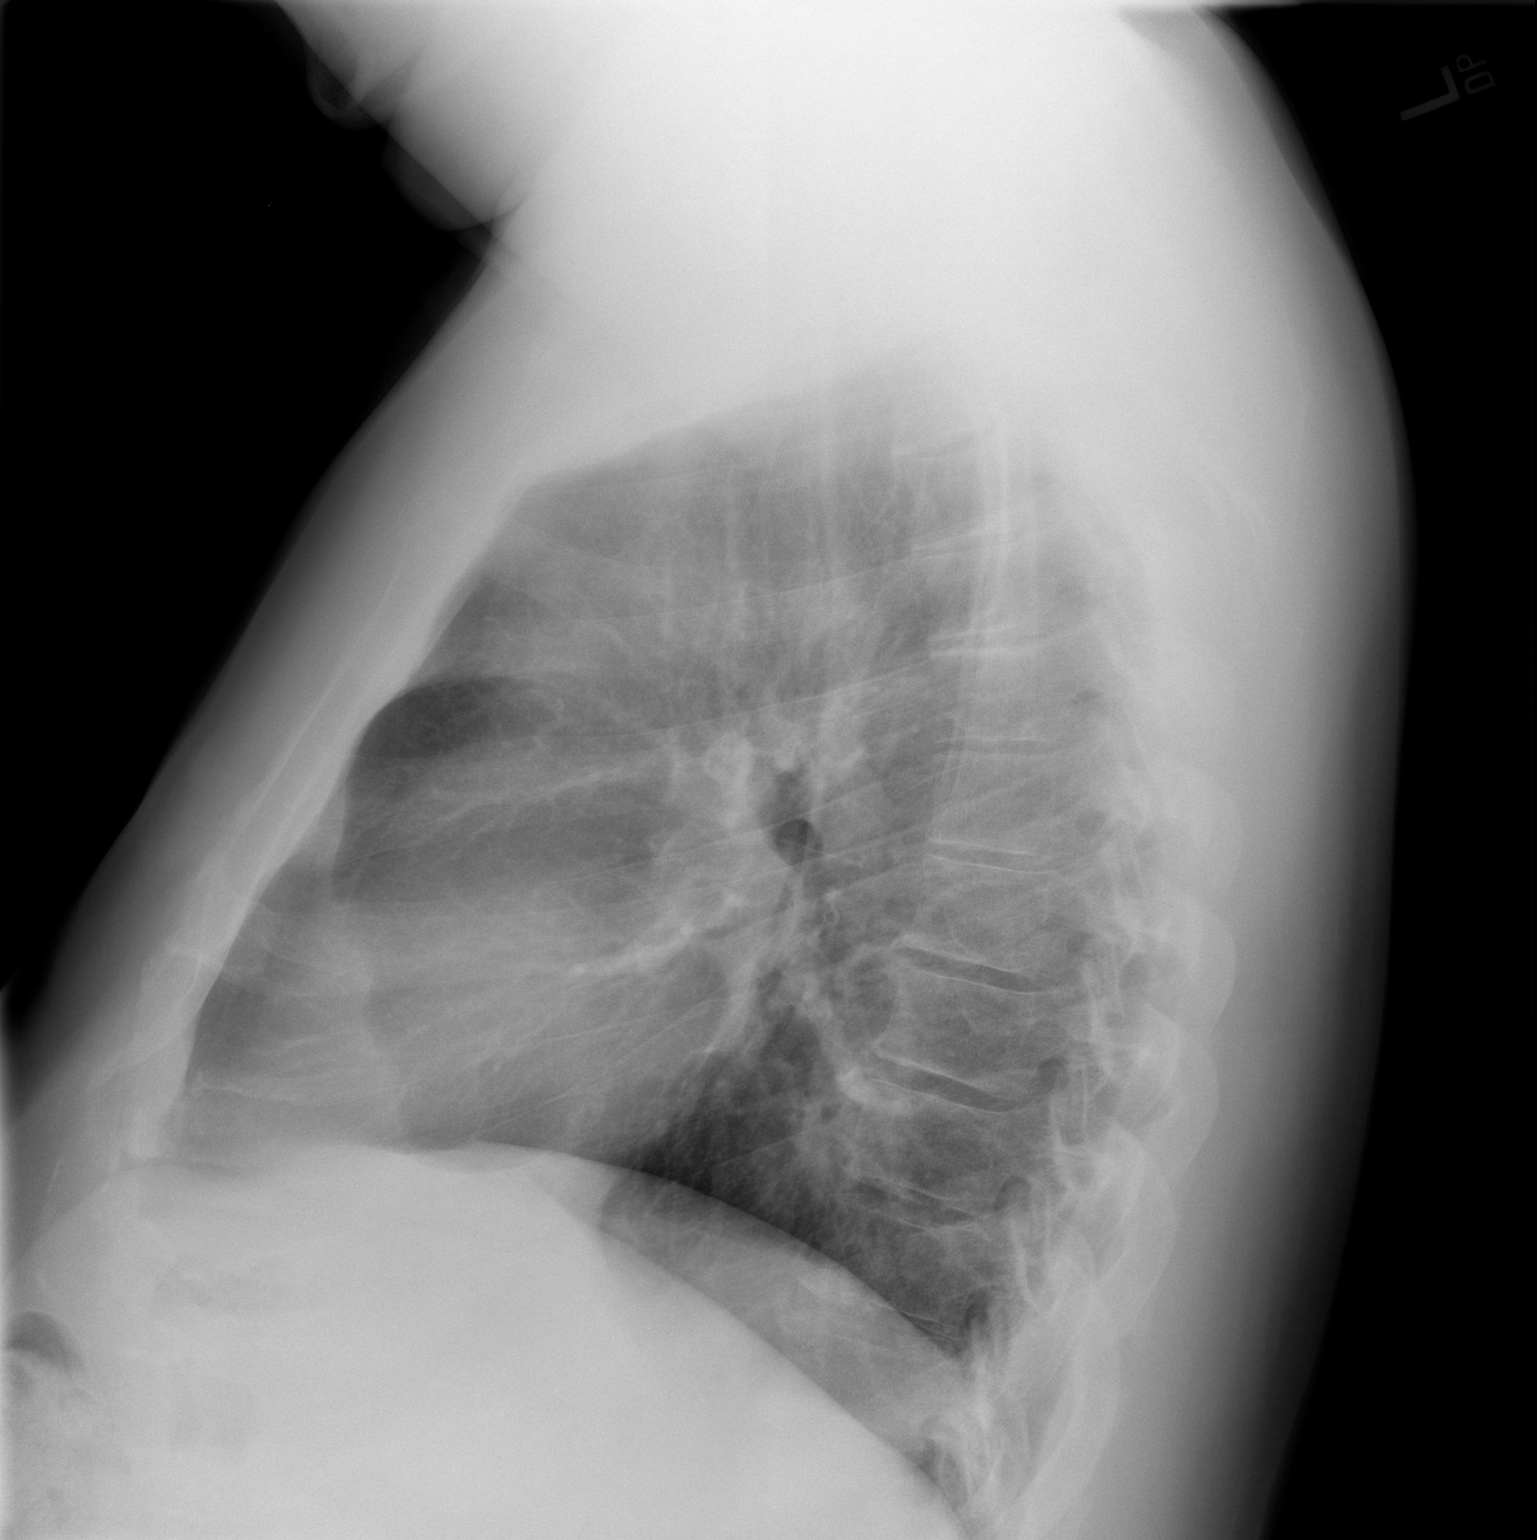

[2 of 2 positions shown; findings below may reference images not displayed]

FINDINGS: Normal heart size, mediastinal contours, and pulmonary vascularity.

Mild peribronchial thickening.

No pulmonary infiltrate, pleural effusion, or pneumothorax.

Question RIGHT nipple shadow, not seen on previous exam.

Minor endplate spur formation thoracic spine with old posttraumatic
deformity of the posterior LEFT sixth rib.
IMPRESSION: Bronchitic changes without infiltrate.

Question RIGHT nipple shadow; repeat PA chest radiograph with nipple
markers recommended to exclude pulmonary nodule.

These results will be called to the ordering clinician or
representative by the Radiologist Assistant, and communication
documented in the PACS or [REDACTED].

## 2021-09-07 ENCOUNTER — Other Ambulatory Visit (HOSPITAL_COMMUNITY): Payer: Self-pay

## 2021-09-09 ENCOUNTER — Other Ambulatory Visit (HOSPITAL_COMMUNITY): Payer: Self-pay

## 2021-09-10 ENCOUNTER — Other Ambulatory Visit (HOSPITAL_COMMUNITY): Payer: Self-pay

## 2021-09-10 MED ORDER — QUETIAPINE FUMARATE 100 MG PO TABS
ORAL_TABLET | ORAL | 3 refills | Status: AC
  Filled 2021-09-10: qty 90, 90d supply, fill #0
  Filled 2021-12-14: qty 90, 90d supply, fill #1
  Filled 2022-03-16: qty 90, 90d supply, fill #2
  Filled 2022-06-19: qty 30, 30d supply, fill #3

## 2021-10-16 ENCOUNTER — Other Ambulatory Visit (HOSPITAL_COMMUNITY): Payer: Self-pay

## 2021-10-16 MED ORDER — AMLODIPINE BESYLATE 10 MG PO TABS
ORAL_TABLET | ORAL | 3 refills | Status: AC
  Filled 2021-10-16: qty 90, 90d supply, fill #0
  Filled 2022-01-11: qty 90, 90d supply, fill #1
  Filled 2022-04-22: qty 30, 30d supply, fill #2
  Filled 2022-05-17: qty 30, 30d supply, fill #3
  Filled 2022-06-19: qty 30, 30d supply, fill #4

## 2021-11-01 ENCOUNTER — Other Ambulatory Visit (HOSPITAL_COMMUNITY): Payer: Self-pay

## 2021-11-05 ENCOUNTER — Other Ambulatory Visit (HOSPITAL_COMMUNITY): Payer: Self-pay

## 2021-11-08 ENCOUNTER — Other Ambulatory Visit (HOSPITAL_COMMUNITY): Payer: Self-pay

## 2021-11-09 ENCOUNTER — Other Ambulatory Visit (HOSPITAL_COMMUNITY): Payer: Self-pay

## 2021-11-12 ENCOUNTER — Other Ambulatory Visit (HOSPITAL_COMMUNITY): Payer: Self-pay

## 2021-11-13 ENCOUNTER — Other Ambulatory Visit (HOSPITAL_COMMUNITY): Payer: Self-pay

## 2021-11-14 ENCOUNTER — Other Ambulatory Visit (HOSPITAL_COMMUNITY): Payer: Self-pay

## 2021-11-14 MED ORDER — METOPROLOL TARTRATE 50 MG PO TABS
50.0000 mg | ORAL_TABLET | Freq: Two times a day (BID) | ORAL | 3 refills | Status: AC
  Filled 2021-11-14 (×3): qty 180, 90d supply, fill #0
  Filled 2022-02-08: qty 180, 90d supply, fill #1
  Filled 2022-05-17: qty 60, 30d supply, fill #2
  Filled 2022-06-19: qty 60, 30d supply, fill #3

## 2021-12-14 ENCOUNTER — Other Ambulatory Visit (HOSPITAL_COMMUNITY): Payer: Self-pay

## 2021-12-20 ENCOUNTER — Other Ambulatory Visit (HOSPITAL_COMMUNITY): Payer: Self-pay

## 2022-01-11 ENCOUNTER — Other Ambulatory Visit (HOSPITAL_COMMUNITY): Payer: Self-pay

## 2022-01-22 ENCOUNTER — Other Ambulatory Visit (HOSPITAL_COMMUNITY): Payer: Self-pay

## 2022-01-29 ENCOUNTER — Other Ambulatory Visit (HOSPITAL_COMMUNITY): Payer: Self-pay

## 2022-02-08 ENCOUNTER — Other Ambulatory Visit (HOSPITAL_COMMUNITY): Payer: Self-pay

## 2022-03-17 ENCOUNTER — Other Ambulatory Visit (HOSPITAL_COMMUNITY): Payer: Self-pay

## 2022-04-23 ENCOUNTER — Other Ambulatory Visit (HOSPITAL_COMMUNITY): Payer: Self-pay

## 2022-05-04 ENCOUNTER — Other Ambulatory Visit (HOSPITAL_COMMUNITY): Payer: Self-pay

## 2022-05-05 ENCOUNTER — Other Ambulatory Visit (HOSPITAL_COMMUNITY): Payer: Self-pay

## 2022-05-05 MED ORDER — TRAZODONE HCL 50 MG PO TABS
50.0000 mg | ORAL_TABLET | Freq: Every day | ORAL | 1 refills | Status: AC
  Filled 2022-05-05: qty 30, 30d supply, fill #0
  Filled 2022-06-04: qty 30, 30d supply, fill #1

## 2022-05-07 ENCOUNTER — Other Ambulatory Visit (HOSPITAL_COMMUNITY): Payer: Self-pay

## 2022-05-17 ENCOUNTER — Other Ambulatory Visit (HOSPITAL_COMMUNITY): Payer: Self-pay

## 2022-05-19 ENCOUNTER — Other Ambulatory Visit (HOSPITAL_COMMUNITY): Payer: Self-pay

## 2022-05-19 MED ORDER — MELOXICAM 7.5 MG PO TABS
7.5000 mg | ORAL_TABLET | Freq: Every day | ORAL | 1 refills | Status: AC
  Filled 2022-05-19: qty 30, 30d supply, fill #0
  Filled 2022-06-19: qty 30, 30d supply, fill #1

## 2022-05-21 ENCOUNTER — Other Ambulatory Visit (HOSPITAL_COMMUNITY): Payer: Self-pay

## 2022-05-23 ENCOUNTER — Other Ambulatory Visit (HOSPITAL_COMMUNITY): Payer: Self-pay

## 2022-06-04 ENCOUNTER — Other Ambulatory Visit (HOSPITAL_COMMUNITY): Payer: Self-pay

## 2022-06-04 ENCOUNTER — Other Ambulatory Visit: Payer: Self-pay | Admitting: Internal Medicine

## 2022-06-04 ENCOUNTER — Ambulatory Visit
Admission: RE | Admit: 2022-06-04 | Discharge: 2022-06-04 | Disposition: A | Discharge status: Home / Self Care | Payer: Managed Care, Other (non HMO) | Source: Ambulatory Visit | Attending: Internal Medicine | Admitting: Internal Medicine

## 2022-06-04 DIAGNOSIS — M5432 Sciatica, left side: Secondary | ICD-10-CM

## 2022-06-05 ENCOUNTER — Other Ambulatory Visit (HOSPITAL_COMMUNITY): Payer: Self-pay

## 2022-06-05 MED ORDER — PREDNISONE 10 MG (21) PO TBPK
ORAL_TABLET | ORAL | 0 refills | Status: AC
  Filled 2022-06-05: qty 21, 6d supply, fill #0

## 2022-06-19 ENCOUNTER — Other Ambulatory Visit (HOSPITAL_COMMUNITY): Payer: Self-pay

## 2022-06-20 ENCOUNTER — Other Ambulatory Visit (HOSPITAL_COMMUNITY): Payer: Self-pay

## 2022-06-25 ENCOUNTER — Other Ambulatory Visit (HOSPITAL_COMMUNITY): Payer: Self-pay

## 2022-11-24 ENCOUNTER — Other Ambulatory Visit (HOSPITAL_COMMUNITY): Payer: Self-pay

## 2022-11-24 MED ORDER — QUETIAPINE FUMARATE 100 MG PO TABS
100.0000 mg | ORAL_TABLET | Freq: Every day | ORAL | 0 refills | Status: AC
  Filled 2022-11-24: qty 30, 30d supply, fill #0

## 2023-03-22 ENCOUNTER — Other Ambulatory Visit: Payer: Self-pay

## 2023-03-22 ENCOUNTER — Emergency Department (HOSPITAL_COMMUNITY)
Admission: EM | Admit: 2023-03-22 | Discharge: 2023-03-22 | Disposition: A | Discharge status: Home / Self Care | Payer: Managed Care, Other (non HMO) | Attending: Emergency Medicine | Admitting: Emergency Medicine

## 2023-03-22 ENCOUNTER — Other Ambulatory Visit (HOSPITAL_COMMUNITY): Payer: Self-pay

## 2023-03-22 ENCOUNTER — Emergency Department (HOSPITAL_COMMUNITY): Payer: Managed Care, Other (non HMO)

## 2023-03-22 ENCOUNTER — Encounter (HOSPITAL_COMMUNITY): Payer: Self-pay | Admitting: Emergency Medicine

## 2023-03-22 DIAGNOSIS — S76912A Strain of unspecified muscles, fascia and tendons at thigh level, left thigh, initial encounter: Secondary | ICD-10-CM | POA: Diagnosis not present

## 2023-03-22 DIAGNOSIS — Z79899 Other long term (current) drug therapy: Secondary | ICD-10-CM | POA: Insufficient documentation

## 2023-03-22 DIAGNOSIS — X58XXXA Exposure to other specified factors, initial encounter: Secondary | ICD-10-CM | POA: Diagnosis not present

## 2023-03-22 DIAGNOSIS — I10 Essential (primary) hypertension: Secondary | ICD-10-CM | POA: Diagnosis not present

## 2023-03-22 DIAGNOSIS — R1032 Left lower quadrant pain: Secondary | ICD-10-CM | POA: Diagnosis present

## 2023-03-22 DIAGNOSIS — S76212A Strain of adductor muscle, fascia and tendon of left thigh, initial encounter: Secondary | ICD-10-CM

## 2023-03-22 MED ORDER — ACETAMINOPHEN 500 MG PO TABS
1000.0000 mg | ORAL_TABLET | Freq: Once | ORAL | Status: AC
  Administered 2023-03-22: 1000 mg via ORAL
  Filled 2023-03-22: qty 2

## 2023-03-22 MED ORDER — KETOROLAC TROMETHAMINE 30 MG/ML IJ SOLN
30.0000 mg | Freq: Once | INTRAMUSCULAR | Status: AC
  Administered 2023-03-22: 30 mg via INTRAMUSCULAR
  Filled 2023-03-22: qty 1

## 2023-03-22 MED ORDER — LIDOCAINE 5 % EX PTCH
1.0000 | MEDICATED_PATCH | Freq: Once | CUTANEOUS | Status: DC
  Administered 2023-03-22: 1 via TRANSDERMAL
  Filled 2023-03-22: qty 1

## 2023-03-22 MED ORDER — LIDOCAINE 5 % EX PTCH
1.0000 | MEDICATED_PATCH | CUTANEOUS | 0 refills | Status: AC
  Filled 2023-03-22: qty 30, 30d supply, fill #0

## 2023-03-22 MED ORDER — CYCLOBENZAPRINE HCL 10 MG PO TABS
10.0000 mg | ORAL_TABLET | Freq: Two times a day (BID) | ORAL | 0 refills | Status: AC | PRN
  Filled 2023-03-22: qty 20, 10d supply, fill #0

## 2023-03-22 MED ORDER — CYCLOBENZAPRINE HCL 10 MG PO TABS
10.0000 mg | ORAL_TABLET | Freq: Once | ORAL | Status: AC
  Administered 2023-03-22: 10 mg via ORAL
  Filled 2023-03-22: qty 1

## 2023-03-22 NOTE — Discharge Instructions (Signed)
You were seen in the emergency department for your leg pain.  You likely strained the muscles on your inner thigh that helps bend your hip up and move your leg inward.  You can continue to take Tylenol and your meloxicam as needed for pain.  You should ice your leg or apply heat tonight given you some lidocaine patches which is a numbing medicine to help relax her muscles.  I have given you a muscle relaxer as well that you can take as needed.  This can make you drowsy so do not take it before driving, working or operating heavy machinery.  You should try to stretch her leg and you can walk on it just would avoid heavy lifting or running until you are cleared by your doctor.  You should follow-up within the next week to have your leg reassessed and you may need physical therapy if this is not improving.  You should return to the emergency department if you have worsening numbness or weakness in your leg, you are unable to walk or urinate or if you have any other new or concerning symptoms.

## 2023-03-22 NOTE — ED Provider Notes (Signed)
EMERGENCY DEPARTMENT AT [facility] Provider Note   CSN: [id number] Arrival date & time: 03/22/23  0559     History  Chief Complaint  Patient presents with   Groin Pain    Bill Ferrell is a 57 y.o. male.  Patient is a 57 year old male with a past medical history of hypertension presenting to the emergency department with left groin pain.  Patient states that he has had pain in his left groin radiating down his anterior thigh since Wednesday.  He states that his left leg does feel tingly.  He states that the pain is worse when he abducts and has pain with ambulation.  He states that he has been working out on the treadmill a lot recently and is unsure if he may have injured it but denies any falls.  He denies any associated back pain.  He denies any bulges in his groin, dysuria or hematuria.  He denies any saddle anesthesia, loss of bowel or bladder function.  He states that he has been taking Motrin for pain without significant relief.  The history is provided by the patient and the spouse.  Groin Pain       Home Medications Prior to Admission medications   Medication Sig Start Date End Date Taking? Authorizing Provider  cyclobenzaprine (FLEXERIL) 10 MG tablet Take 1 tablet (10 mg total) by mouth 2 (two) times daily as needed for muscle spasms. 03/22/23  Yes Theresia Lo, [place] K, DO  lidocaine (LIDODERM) 5 % Place 1 patch onto the skin daily. Remove & Discard patch within 12 hours or as directed by MD 03/22/23  Yes Theresia Lo, Benetta Spar K, DO  albuterol (VENTOLIN HFA) 108 (90 Base) MCG/ACT inhaler INHALE 2 PUFFS INTO THE LUNGS EVERY 4 HOURS AS NEEDED 30 DAYS 08/01/20 08/01/21  Renford Dills, MD  amLODipine (NORVASC) 10 MG tablet Take 10 mg by mouth daily. 03/04/19   [provider]  amLODipine (NORVASC) 10 MG tablet TAKE 1 TABLET BY MOUTH ONCE A DAY 09/12/20 09/12/21  Renford Dills, MD  amLODipine (NORVASC) 10 MG tablet TAKE 1 TABLET BY MOUTH ONCE A DAY 02/03/20  02/02/21  Dartha Lodge, FNP  amLODipine (NORVASC) 10 MG tablet Take 1 tablet by mouth once daily 10/16/21     benzonatate (TESSALON) 100 MG capsule Take 1 capsule (100 mg total) by mouth every 8 (eight) hours. Patient not taking: Reported on 11/23/2015 12/20/14   [name], PA-C  colchicine 0.6 MG tablet Take 0.6 mg by mouth daily.    [provider]  doxycycline (VIBRAMYCIN) 100 MG capsule Take 1 capsule (100 mg total) by mouth 2 (two) times daily. Patient not taking: Reported on 11/23/2015 09/26/15   [name], MD  gabapentin (NEURONTIN) 300 MG capsule Take 300 mg by mouth 3 (three) times daily. 02/17/20   [provider]  lisinopril (PRINIVIL,ZESTRIL) 10 MG tablet Take 10 mg by mouth daily.    [provider]  loratadine (CLARITIN) 10 MG tablet Take 10 mg by mouth daily as needed for allergies.    [provider]  meloxicam (MOBIC) 7.5 MG tablet Take 7.5 mg by mouth daily. 04/01/19   [provider]  meloxicam (MOBIC) 7.5 MG tablet Take 1 tablet (7.5 mg total) by mouth daily. 05/19/22     methocarbamol (ROBAXIN) 500 MG tablet Take 2 tablets (1,000 mg total) by mouth 4 (four) times daily. 12/20/14   [name], PA-C  metoprolol tartrate (LOPRESSOR) 25 MG tablet Take 25 mg by  mouth 2 (two) times daily. 04/10/19   [provider]  metoprolol tartrate (LOPRESSOR) 50 MG tablet TAKE 1 TABLET BY MOUTH 2 TIMES A DAY 09/12/20 09/12/21  Renford Dills, MD  metoprolol tartrate (LOPRESSOR) 50 MG tablet TAKE 1 TABLET BY MOUTH TWICE DAILY 02/03/20 02/02/21  Dartha Lodge, FNP  metoprolol tartrate (LOPRESSOR) 50 MG tablet Take 1 tablet (50 mg total) by mouth 2 (two) times daily. 11/14/21     Multiple Vitamins-Minerals (MULTIVITAMIN WITH MINERALS) tablet Take 1 tablet by mouth daily.    [provider]  nabumetone (RELAFEN) 750 MG tablet Take 750 mg by mouth every 12 (twelve) hours. 04/26/19   [provider]  naproxen (NAPROSYN) 500 MG  tablet Take 1 tablet (500 mg total) by mouth 2 (two) times daily. 07/31/17   Wieters, Hallie C, PA-C  QUEtiapine (SEROQUEL) 100 MG tablet TAKE 1 TABLET BY MOUTH EVERYDAY AT BEDTIME 02/11/19   [provider]  QUEtiapine (SEROQUEL) 100 MG tablet TAKE 1 TABLET BY MOUTH EVERY NIGHT AT BEDTIME 02/03/20 02/02/21  Dartha Lodge, FNP  QUEtiapine (SEROQUEL) 100 MG tablet Take 1 tablet by mouth  at bedtime 09/10/21     QUEtiapine (SEROQUEL) 100 MG tablet Take 1 tablet (100 mg total) by mouth at bedtime. 11/24/22     sertraline (ZOLOFT) 50 MG tablet Take 50 mg by mouth daily. 04/01/19   [provider]  simethicone (MYLICON) 80 MG chewable tablet Chew 160 mg by mouth every 6 (six) hours as needed for flatulence.    [provider]  traZODone (DESYREL) 50 MG tablet TAKE 1 TABLET BY MOUTH EVERYDAY AT BEDTIME 04/06/19   [provider]  traZODone (DESYREL) 50 MG tablet TAKE 1 TABLET BY MOUTH EVERY NIGHT AT BEDTIME 02/03/20 02/02/21  Dartha Lodge, FNP  traZODone (DESYREL) 50 MG tablet TAKE 1 TABLET BY MOUTH EVERY NIGHT AT BEDTIME 11/11/19 11/10/20  Dartha Lodge, FNP  traZODone (DESYREL) 50 MG tablet Take 1 tablet by mouth once a day at bedtime as needed. 01/25/21     traZODone (DESYREL) 50 MG tablet Take 1 tablet (50 mg total) by mouth at bedtime. 05/05/22     vitamin B-12 (CYANOCOBALAMIN) 100 MCG tablet  03/31/20   [provider]      Allergies    Patient has no known allergies.    Review of Systems   Review of Systems  Physical Exam Updated Vital Signs BP 131/75   Pulse 60   Temp 98 F (36.7 C)   Resp 18   SpO2 100%  Physical Exam Vitals and nursing note reviewed.  Constitutional:      General: He is not in acute distress.    Appearance: Normal appearance.  HENT:     Head: Normocephalic and atraumatic.     Nose: Nose normal.     Mouth/Throat:     Mouth: Mucous membranes are moist.  Eyes:     Extraocular Movements: Extraocular movements intact.   Cardiovascular:     Rate and Rhythm: Normal rate.     Pulses: Normal pulses.  Pulmonary:     Effort: Pulmonary effort is normal.  Abdominal:     General: Abdomen is flat.     Palpations: Abdomen is soft.     Tenderness: There is no abdominal tenderness.     Hernia: No hernia is present.  Musculoskeletal:     Cervical back: Normal range of motion.     Comments: No midline neck or back tenderness No bony tenderness of bilateral  lower extremities Significant tenderness to left groin and left medial thigh muscles with increased pain with hip internal and external rotation as well as hip flexion  Skin:    General: Skin is warm and dry.  Neurological:     General: No focal deficit present.     Mental Status: He is alert and oriented to person, place, and time.     Sensory: No sensory deficit.     Motor: No weakness.  Psychiatric:        Mood and Affect: Mood normal.        Behavior: Behavior normal.     ED Results / Procedures / Treatments   Labs (all labs ordered are listed, but only abnormal results are displayed) Labs Reviewed - No data to display  EKG None  Radiology DG Hip Unilat With Pelvis 2-3 Views Left  Result Date: 03/22/2023 CLINICAL DATA:  Left hip and groin pain, no injury EXAM: DG HIP (WITH OR WITHOUT PELVIS) 2-3V LEFT COMPARISON:  None Available. FINDINGS: There is no evidence of hip fracture or dislocation. There is no evidence of arthropathy or other focal bone abnormality. IMPRESSION: No fracture or dislocation of the left hip. Joint spaces preserved. No radiographic findings to explain pain. Electronically Signed   By: Jearld Lesch M.D.   On: 03/22/2023 10:14    Procedures Procedures    Medications Ordered in ED Medications  lidocaine (LIDODERM) 5 % 1-3 patch (1 patch Transdermal Patch Applied 03/22/23 0955)  acetaminophen (TYLENOL) tablet 1,000 mg (1,000 mg Oral Given 03/22/23 0954)  ketorolac (TORADOL) 30 MG/ML injection 30 mg (30 mg Intramuscular  Given 03/22/23 0954)  cyclobenzaprine (FLEXERIL) tablet 10 mg (10 mg Oral Given 03/22/23 0954)    ED Course/ Medical Decision Making/ A&P Clinical Course as of 03/22/23 1118  Sun Mar 22, 2023  1019 Xray without acute abnormality. [VK]    Clinical Course User Index [VK] Rexford Maus, DO                                 Medical Decision Making This patient presents to the ED with chief complaint(s) of L groin pain with pertinent past medical history of HTN which further complicates the presenting complaint. The complaint involves an extensive differential diagnosis and also carries with it a high risk of complications and morbidity.    The differential diagnosis includes no palpable hernia making incarceration or strangulation of the hernia unlikely, considering muscle strain or spasm, no associated back pain making sciatica unlikely, considering hip fracture dislocation, he is neurovascularly intact making neurovascular injury unlikely  Additional history obtained: Additional history obtained from spouse Records reviewed Primary Care Documents  ED Course and Reassessment: Patient's arrival he is hemodynamically stable in no acute distress.  He does have significant pain to his left hip abductors concerning for muscle strain or spasm.  He will have x-ray to evaluate for bony abnormality and will be treated with Tylenol, Toradol, Flexeril and lidocaine patch and will be closely reassessed.  Independent labs interpretation:  N/A  Independent visualization of imaging: - I independently visualized the following imaging with scope of interpretation limited to determining acute life threatening conditions related to emergency care: L hip XR, which revealed no acute disease  Consultation: - Consulted or discussed management/test interpretation w/ external professional: N/A  Consideration for admission or further workup: Patient has no emergent conditions requiring admission or further  work-up at this time  and is stable for discharge home with primary care follow-up  Social Determinants of health: N/A    Amount and/or Complexity of Data Reviewed Radiology: ordered.  Risk OTC drugs. Prescription drug management.          Final Clinical Impression(s) / ED Diagnoses Final diagnoses:  Inguinal strain, left, initial encounter    Rx / DC Orders ED Discharge Orders          Ordered    cyclobenzaprine (FLEXERIL) 10 MG tablet  2 times daily PRN        03/22/23 1115    lidocaine (LIDODERM) 5 %  Every 24 hours        03/22/23 1115              Rexford Maus, DO 03/22/23 1118

## 2023-03-22 NOTE — ED Triage Notes (Signed)
Patient coming to ED for evaluation of pain to L groin. Reports pain started on Wednesday.  Pain had increased and causes difficulty ambulating.  No reports of injury.  No c/o dysuria or testicle swelling.  Pain radiates down L leg.  Intermittent numbness

## 2023-03-23 ENCOUNTER — Other Ambulatory Visit (HOSPITAL_COMMUNITY): Payer: Self-pay

## 2024-04-27 ENCOUNTER — Ambulatory Visit: Admitting: Physician Assistant

## 2024-04-27 ENCOUNTER — Encounter: Payer: Self-pay | Admitting: Radiology

## 2024-04-27 ENCOUNTER — Other Ambulatory Visit: Payer: Self-pay | Admitting: Radiology

## 2024-04-27 ENCOUNTER — Other Ambulatory Visit: Payer: Self-pay

## 2024-04-27 ENCOUNTER — Encounter: Payer: Self-pay | Admitting: Physician Assistant

## 2024-04-27 DIAGNOSIS — R200 Anesthesia of skin: Secondary | ICD-10-CM

## 2024-04-27 NOTE — Progress Notes (Signed)
 Office Visit Note   Patient: Bill Ferrell           Date of Birth: [birth year]           MRN: [id number] Visit Date: 04/27/2024              Requested by: Bill Ingle, MD [address] [place],  [place] [address] PCP: Bill Ingle, MD   Assessment & Plan: Visit Diagnoses:  1. Left leg numbness     Plan: Given the fact that he has had no formal therapy for this most acute episode of back pain recommend formal therapy to work on range of motion, core strengthening, stretching IT band and hamstrings.  Include a home exercise program and modalities.  We provided him with handouts for back exercises that he can begin on his own.  Take him out of work due to the fact that he is having difficulty even ambulating long distances and he stands mostly for work and is unable to do this at this point in time.  Will reevaluate his work status in approximately 6 weeks.  If he improves to the point that he would like to go back to work sooner we will provide him with a note.  He will continue his meloxicam  7.5 mg twice daily.  Encouraged him to stay mobile.  Ice heat to the back as needed.  He can also continue Tylenol .  Questions were encouraged and answered at length.  If his condition worsens in any way or he has bowel bladder dysfunction or weakness in his lower extremities he will go to the ER.  Provided him with a temporary handicap permit.  Work note was given but he may need short-term disability paperwork filled out by our office this.  Follow-Up Instructions: Return in about 5 weeks (around 06/02/2024).   Orders:  Orders Placed This Encounter  Procedures   XR Lumbar Spine 2-3 Views   No orders of the defined types were placed in this encounter.     Procedures: No procedures performed   Clinical Data: No additional findings.   Subjective: Chief Complaint  Patient presents with   Left Hip - Pain   Lower Back - Pain    HPI Bill Ferrell 58 year old male were seen for  the first time for left leg numbness from his buttocks down to his left ankle.  States this been on and off over the years.  But are worse over the last 6 months.  He has had some groin pain.  Was seen in the ER and left hip x-rays were performed I personally reviewed these.  And showed AP pelvis send lateral and AP views of the left hip without any acute findings.  No significant arthropathy.  No bony abnormalities.  The patient was tried Medrol  Dosepak a week ago with no real relief.  He has also tried NSAIDs including Mobic  which she continues to take.  He has tried Tylenol  and muscle relaxants.  None of these have been very beneficial.  He denies any weight change saddle anesthesia bowel or bladder dysfunction fevers or chills.  Does note that the back pain awakens him at night.  Pain is worse with standing and he describes pain 10 out of 10.  He has began using a cane over the last couple of days due to the back pain.  Pain does not radiate down into the foot.  He has had formal therapy for his back years ago through [employer] which was stretching.  He was given a home exercise program which he no longer continues.  Review of Systems See HPI.  Objective: Vital Signs: There were no vitals taken for this visit.  Physical Exam Constitutional:      Appearance: He is not ill-appearing or diaphoretic.  Cardiovascular:     Pulses: Normal pulses.  Pulmonary:     Effort: Pulmonary effort is normal.  Neurological:     Mental Status: He is alert and oriented to person, place, and time.  Psychiatric:        Mood and Affect: Mood normal.     Ortho Exam Bilateral hips good range of motion without pain.  Nontender over the trochanteric regions.  5-5 strength with flexion of both hips against resistance. Lower extremities: 5 out of 5 strength throughout the lower extremities against resistance.  Dorsal pedal pulses are 2+ and equal symmetric calf supple nontender bilaterally.  Sensation  subjectively intact throughout both feet to light touch. Lumbar spine: He comes within 3 to 4 inches of being able to touch his toes with forward flexion and has pain with this.  Extension of lumbar spine is also limited but not quite as painful.  Negative straight leg raise bilaterally.  5 out of 5 strength throughout the lower extremities against resistance.  Able to walk on heels and tiptoes.  Exquisitely tight hamstrings bilaterally.  Specialty Comments:  No specialty comments available.  Imaging: XR Lumbar Spine 2-3 Views Result Date: 04/27/2024 Lumbar spine 2 views: No spondylolisthesis no acute fractures.  This space overall well-maintained except for L4-5 S1 which is slightly narrowed.  Minimal anterior vertebral spurring at multiple levels otherwise no significant arthritic changes.  Loss of lordotic curvature.    PMFS History: Patient Active Problem List   Diagnosis Date Noted   Alcohol dependence with uncomplicated withdrawal (HCC) 10/08/2014   Substance induced mood disorder (HCC) 10/08/2014   Cocaine abuse (HCC) 10/08/2014   Past Medical History:  Diagnosis Date   Gout    Hypertension     History reviewed. No pertinent family history.  History reviewed. No pertinent surgical history. Social History   Occupational History   Not on file  Tobacco Use   Smoking status: Every Day   Smokeless tobacco: Never  Vaping Use   Vaping status: Never Used  Substance and Sexual Activity   Alcohol use: Yes   Drug use: Not on file   Sexual activity: Not on file

## 2024-05-01 NOTE — Therapy (Signed)
 OUTPATIENT PHYSICAL THERAPY THORACOLUMBAR EVALUATION   Patient Name: Bill Ferrell MRN: [id number] DOB:September 04, 1965, 58 y.o., male Today's Date: 05/02/2024  END OF SESSION:  PT End of Session - 05/02/24 0923     Visit Number 1    Number of Visits 17    Date for Recertification  06/27/24    PT Start Time 0840    PT Stop Time 0920    PT Time Calculation (min) 40 min    Activity Tolerance Patient tolerated treatment well    Behavior During Therapy [facility] for tasks assessed/performed          Past Medical History:  Diagnosis Date   Gout    Hypertension    History reviewed. No pertinent surgical history. Patient Active Problem List   Diagnosis Date Noted   Alcohol dependence with uncomplicated withdrawal (HCC) 10/08/2014   Substance induced mood disorder (HCC) 10/08/2014   Cocaine abuse (HCC) 10/08/2014    PCP: Rexanne Ingle, MD   REFERRING PROVIDER: Gretta Bertrum ORN, PA-C   REFERRING DIAG: R20.0 (ICD-10-CM) - Left leg numbness   Rationale for Evaluation and Treatment: Rehabilitation  THERAPY DIAG:  Radiculopathy, lumbar region  Other low back pain  Other abnormalities of gait and mobility  ONSET DATE: 6 months ago  SUBJECTIVE:                                                                                                                                                                                           SUBJECTIVE STATEMENT: Couple years ago the LLE and groin started hurting.  It has progressed.  States he also feels numbness that is worse with standing.  Does not feel it in the toes.  Started using a cane a week or 2 ago-to  keep the pressure off.  PERTINENT HISTORY:  No spinal surgeries.    PAIN:  Are you having pain? Yes: NPRS scale: 5/10-10/10 Pain location: L groin, buttuck Pain description: stabbing, tingling Aggravating factors: standing, sitting, sleeping on the L side Relieving factors: stretching  PRECAUTIONS: None  RED FLAGS: Bowel or  bladder incontinence: No   WEIGHT BEARING RESTRICTIONS: No  FALLS:  Has patient fallen in last 6 months? No  LIVING ENVIRONMENT: Lives with: lives with their family Lives in: House/apartment Stairs: Yes: Internal: 12 steps; has rails Has following equipment at home: Single point cane  OCCUPATION: meat cutter   PLOF: Independent  PATIENT GOALS: Decrease pain.  NEXT MD VISIT: unknown  OBJECTIVE:  Note: Objective measures were completed at Evaluation unless otherwise noted.  DIAGNOSTIC FINDINGS:  04/28/24  Xray Lumbar spine 2 views: No spondylolisthesis no acute fractures.  This space  overall well-maintained except for L4-5 S1 which is slightly narrowed.   Minimal anterior vertebral spurring at multiple levels otherwise no  significant arthritic changes.  Loss of lordotic curvature.   PATIENT SURVEYS:  PSFS: THE PATIENT SPECIFIC FUNCTIONAL SCALE  Place score of 0-10 (0 = unable to perform activity and 10 = able to perform activity at the same level as before injury or problem)  Activity Date: 05/02/24    standing 5    2.walking 5    3.sleeping on L 3    4.      Total Score 4.33      Total Score = Sum of activity scores/number of activities  Minimally Detectable Change: 3 points (for single activity); 2 points (for average score)  [facility] (nd). The Patient Specific Functional Scale . Retrieved from [email or web address]   COGNITION: Overall cognitive status: Within functional limits for tasks assessed     SENSATION:NT   MUSCLE LENGTH: Hamstrings: Right mod to severe restriction   POSTURE: LE ER, flattened lordosis  PALPATION: 1+ tenderness R PVM  LUMBAR ROM:   AROM eval  Flexion 50%  Extension 50%  Right lateral flexion 100%  Left lateral flexion 50%P  Right rotation 75%  Left rotation 75%   (Blank rows = not tested)  LOWER EXTREMITY ROM:     Active /Passive Right eval Left eval   Hip flexion    Hip extension    Hip abduction  25 P groin  Hip adduction    Hip internal rotation    Hip external rotation    Knee flexion    Knee extension    Ankle dorsiflexion    Ankle plantarflexion    Ankle inversion    Ankle eversion     (Blank rows = not tested)  LOWER EXTREMITY MMT:    MMT Right eval Left eval  Hip flexion  4+  Hip extension    Hip abduction    Hip adduction    Hip internal rotation    Hip external rotation    Knee flexion    Knee extension  4+  Ankle dorsiflexion  4+  Ankle plantarflexion    Ankle inversion    Ankle eversion     (Blank rows = not tested)  LUMBAR SPECIAL TESTS:  Straight leg raise test: Negative  FUNCTIONAL TESTS:  5 times sit to stand: 23 sec  GAIT: Distance walked: Tree surgeon device utilized: Single point cane Level of assistance: Complete Independence Comments: -  TREATMENT DATE:  05/02/24 Initial evaluation of lumbar spine completed followed by instruction and trial set of HEP.                                                                                                                                 PATIENT EDUCATION:  Education details: HEP Person educated: Patient Education method: Explanation, Demonstration, Actor cues, and Verbal cues Education comprehension: verbalized  understanding, returned demonstration, verbal cues required, and tactile cues required  HOME EXERCISE PROGRAM: Access Code: [email or web address] URL: [email or web address] Date: 05/02/2024 Prepared by: Burnard Meth  Exercises - Supine Bridge  - 2 x daily - 7 x weekly - 2 sets - 10 reps - Supine March  - 2 x daily - 7 x weekly - 2 sets - 10 reps - Supine Lower Trunk Rotation  - 2 x daily - 7 x weekly - 2 sets - 20 reps - Supine Single Knee to Chest Stretch  - 2 x daily - 7 x weekly - 1 sets - 3 reps - 25 hold  ASSESSMENT:  CLINICAL IMPRESSION: Patient is a 58 y.o. male who was seen today for physical therapy  evaluation and treatment for lumbar pain and LLE numbness.  He presents with decreased ROM, decreased strength of lumbar stabilizers, pain and antalgic gait.  He would benefit from skilled PT to address these deficits and return to ADLs, work and community activities.   OBJECTIVE IMPAIRMENTS: Abnormal gait, decreased ROM, decreased strength, increased muscle spasms, impaired flexibility, impaired sensation, and pain.   ACTIVITY LIMITATIONS: standing, squatting, sleeping, and transfers  PARTICIPATION LIMITATIONS: community activity and occupation  PERSONAL FACTORS: Fitness are also affecting patient's functional outcome.   REHAB POTENTIAL: Good  CLINICAL DECISION MAKING: Stable/uncomplicated  EVALUATION COMPLEXITY: Moderate   GOALS: Goals reviewed with patient? Yes  SHORT TERM GOALS: Target date: 05/23/2024    Patient to be independent with HEP. Baseline: Goal status: INITIAL  2.  Decrease pain by 1 level. Baseline:  Goal status: INITIAL  LONG TERM GOALS: Target date: 06/27/2024 8 weeks    Patient to be independent with self progressive HEP at time of discharge. Baseline:  Goal status: INITIAL  2.  Increase active range of motion by 25%. Baseline:  Goal status: INITIAL  3.  Increase strength of lower extremities and spinal stabilizers by half grade to 1 full grade by discharge. Baseline:  Goal status: INITIAL  4.  Improve numbness in left lower extremity by 50%. Baseline:  Goal status: INITIAL  5.  Improve patient's score on PSFS by greater than 3 points to show detectable change. Baseline:  Goal status: INITIAL  6.  Able to ambulate without AD community distances with no pain or symptoms. Baseline:  Goal status: INITIAL  PLAN:  PT FREQUENCY: 2x/week  PT DURATION: 8 weeks  PLANNED INTERVENTIONS: 97164- PT Re-evaluation, 97110-Therapeutic exercises, 97530- Therapeutic activity, 97112- Neuromuscular re-education, 97535- Self Care, [id number]- Manual therapy,  [phone]- Aquatic Therapy, Patient/Family education, Balance training, Spinal mobilization, Cryotherapy, and Moist heat.  PLAN FOR NEXT SESSION: Start on Nustep.   Burnard CHRISTELLA Meth, PT 05/02/2024, 9:24 AM

## 2024-05-02 ENCOUNTER — Other Ambulatory Visit: Payer: Self-pay

## 2024-05-02 ENCOUNTER — Ambulatory Visit

## 2024-05-02 DIAGNOSIS — M5416 Radiculopathy, lumbar region: Secondary | ICD-10-CM | POA: Diagnosis not present

## 2024-05-02 DIAGNOSIS — R2689 Other abnormalities of gait and mobility: Secondary | ICD-10-CM

## 2024-05-02 DIAGNOSIS — M5459 Other low back pain: Secondary | ICD-10-CM | POA: Diagnosis not present

## 2024-05-02 NOTE — Therapy (Signed)
 OUTPATIENT PHYSICAL THERAPY THORACOLUMBAR TREATMENT   Patient Name: Bill Ferrell MRN: [id number] DOB:1965/08/22, 58 y.o., male Today's Date: 05/03/2024  END OF SESSION:  PT End of Session - 05/03/24 1340     Visit Number 2    Number of Visits 17    Date for Recertification  06/27/24    PT Start Time 1300    PT Stop Time 1338    PT Time Calculation (min) 38 min    Activity Tolerance Patient tolerated treatment well    Behavior During Therapy WFL for tasks assessed/performed           Past Medical History:  Diagnosis Date   Gout    Hypertension    History reviewed. No pertinent surgical history. Patient Active Problem List   Diagnosis Date Noted   Alcohol dependence with uncomplicated withdrawal (HCC) 10/08/2014   Substance induced mood disorder (HCC) 10/08/2014   Cocaine abuse (HCC) 10/08/2014    PCP: Rexanne Ingle, MD   REFERRING PROVIDER: Rexanne Ingle, MD   REFERRING DIAG: R20.0 (ICD-10-CM) - Left leg numbness   Rationale for Evaluation and Treatment: Rehabilitation  THERAPY DIAG:  Radiculopathy, lumbar region  Other low back pain  Other abnormalities of gait and mobility  ONSET DATE: 6 months ago  SUBJECTIVE:                                                                                                                                                                                           SUBJECTIVE STATEMENT: Pt states he started with HEP.  No problems reported.   Eval-Couple years ago the LLE and groin started hurting.  It has progressed.  States he also feels numbness that is worse with standing.  Does not feel it in the toes.  Started using a cane a week or 2 ago-to  keep the pressure off.  PERTINENT HISTORY:  No spinal surgeries.    PAIN:  Are you having pain? Yes: NPRS scale: 5/10-10/10 Pain location: L groin, buttuck Pain description: stabbing, tingling Aggravating factors: standing, sitting, sleeping on the L side Relieving  factors: stretching  PRECAUTIONS: None  RED FLAGS: Bowel or bladder incontinence: No   WEIGHT BEARING RESTRICTIONS: No  FALLS:  Has patient fallen in last 6 months? No  LIVING ENVIRONMENT: Lives with: lives with their family Lives in: House/apartment Stairs: Yes: Internal: 12 steps; has rails Has following equipment at home: Single point cane  OCCUPATION: meat cutter   PLOF: Independent  PATIENT GOALS: Decrease pain.  NEXT MD VISIT: unknown  OBJECTIVE:  Note: Objective measures were completed at Evaluation unless otherwise noted.  DIAGNOSTIC FINDINGS:  04/28/24  Xray Lumbar spine 2 views: No spondylolisthesis no acute fractures.  This space  overall well-maintained except for L4-5 S1 which is slightly narrowed.   Minimal anterior vertebral spurring at multiple levels otherwise no  significant arthritic changes.  Loss of lordotic curvature.   PATIENT SURVEYS:  PSFS: THE PATIENT SPECIFIC FUNCTIONAL SCALE  Place score of 0-10 (0 = unable to perform activity and 10 = able to perform activity at the same level as before injury or problem)  Activity Date: 05/02/24    standing 5    2.walking 5    3.sleeping on L 3    4.      Total Score 4.33      Total Score = Sum of activity scores/number of activities  Minimally Detectable Change: 3 points (for single activity); 2 points (for average score)  [facility] (nd). The Patient Specific Functional Scale . Retrieved from [email or web address]   COGNITION: Overall cognitive status: Within functional limits for tasks assessed     SENSATION:NT   MUSCLE LENGTH: Hamstrings: Right mod to severe restriction   POSTURE: LE ER, flattened lordosis  PALPATION: 1+ tenderness R PVM  LUMBAR ROM:   AROM eval  Flexion 50%  Extension 50%  Right lateral flexion 100%  Left lateral flexion 50%P  Right rotation 75%  Left rotation 75%   (Blank rows = not  tested)  LOWER EXTREMITY ROM:     Active /Passive Right eval Left eval  Hip flexion    Hip extension    Hip abduction  25 P groin  Hip adduction    Hip internal rotation    Hip external rotation    Knee flexion    Knee extension    Ankle dorsiflexion    Ankle plantarflexion    Ankle inversion    Ankle eversion     (Blank rows = not tested)  LOWER EXTREMITY MMT:    MMT Right eval Left eval  Hip flexion  4+  Hip extension    Hip abduction    Hip adduction    Hip internal rotation    Hip external rotation    Knee flexion    Knee extension  4+  Ankle dorsiflexion  4+  Ankle plantarflexion    Ankle inversion    Ankle eversion     (Blank rows = not tested)  LUMBAR SPECIAL TESTS:  Straight leg raise test: Negative  FUNCTIONAL TESTS:  5 times sit to stand: 23 sec  GAIT: Distance walked: Restaurant manager, fast food utilized: Single point cane Level of assistance: Complete Independence Comments: -  TREATMENT DATE: L sciatica 05/03/24 Nustep level 3   Sit to stand 15# 2x5 Prone props 30 sec up x5 Prone hip ext 2x10 bilateral Bridges 2x10 Supine Marching 2x20    05/02/24 Initial evaluation of lumbar spine completed followed by instruction and trial set of HEP.  PATIENT EDUCATION:  Education details: HEP Person educated: Patient Education method: Explanation, Demonstration, Actor cues, and Verbal cues Education comprehension: verbalized understanding, returned demonstration, verbal cues required, and tactile cues required  HOME EXERCISE PROGRAM: Access Code: [email or web address] URL: [email or web address] Date: 05/03/2024 Prepared by: Burnard Meth  Exercises - Supine Bridge  - 2 x daily - 7 x weekly - 2 sets - 10 reps - Supine March  - 2 x daily - 7 x weekly - 2 sets - 10 reps - Supine Lower Trunk Rotation  - 2 x  daily - 7 x weekly - 2 sets - 20 reps - Supine Single Knee to Chest Stretch  - 2 x daily - 7 x weekly - 1 sets - 3 reps - 25 hold - Prone Press Up On Elbows  - 2 x daily - 7 x weekly - 1 sets - 10 reps - 20 hold ASSESSMENT:  CLINICAL IMPRESSION: Patient presents with decreased tingling in the prone position.  Added to HEP. OBJECTIVE IMPAIRMENTS: Abnormal gait, decreased ROM, decreased strength, increased muscle spasms, impaired flexibility, impaired sensation, and pain.   ACTIVITY LIMITATIONS: standing, squatting, sleeping, and transfers  PARTICIPATION LIMITATIONS: community activity and occupation  PERSONAL FACTORS: Fitness are also affecting patient's functional outcome.   REHAB POTENTIAL: Good  CLINICAL DECISION MAKING: Stable/uncomplicated  EVALUATION COMPLEXITY: Moderate   GOALS: Goals reviewed with patient? Yes  SHORT TERM GOALS: Target date: 05/23/2024    Patient to be independent with HEP. Baseline: Goal status: INITIAL  2.  Decrease pain by 1 level. Baseline:  Goal status: INITIAL  LONG TERM GOALS: Target date: 06/27/2024 8 weeks    Patient to be independent with self progressive HEP at time of discharge. Baseline:  Goal status: INITIAL  2.  Increase active range of motion by 25%. Baseline:  Goal status: INITIAL  3.  Increase strength of lower extremities and spinal stabilizers by half grade to 1 full grade by discharge. Baseline:  Goal status: INITIAL  4.  Improve numbness in left lower extremity by 50%. Baseline:  Goal status: INITIAL  5.  Improve patient's score on PSFS by greater than 3 points to show detectable change. Baseline:  Goal status: INITIAL  6.  Able to ambulate without AD community distances with no pain or symptoms. Baseline:  Goal status: INITIAL  PLAN:  PT FREQUENCY: 2x/week  PT DURATION: 8 weeks  PLANNED INTERVENTIONS: 97164- PT Re-evaluation, 97110-Therapeutic exercises, 97530- Therapeutic activity, 97112- Neuromuscular  re-education, 97535- Self Care, 02859- Manual therapy, [phone]- Aquatic Therapy, Patient/Family education, Balance training, Spinal mobilization, Cryotherapy, and Moist heat.  PLAN FOR NEXT SESSION: Add leg press if tolerated.   Burnard CHRISTELLA Meth, PT 05/03/2024, 1:41 PM

## 2024-05-03 ENCOUNTER — Ambulatory Visit (INDEPENDENT_AMBULATORY_CARE_PROVIDER_SITE_OTHER)

## 2024-05-03 DIAGNOSIS — R2689 Other abnormalities of gait and mobility: Secondary | ICD-10-CM | POA: Diagnosis not present

## 2024-05-03 DIAGNOSIS — M5459 Other low back pain: Secondary | ICD-10-CM | POA: Diagnosis not present

## 2024-05-03 DIAGNOSIS — M5416 Radiculopathy, lumbar region: Secondary | ICD-10-CM | POA: Diagnosis not present

## 2024-05-09 ENCOUNTER — Ambulatory Visit

## 2024-05-09 DIAGNOSIS — M5459 Other low back pain: Secondary | ICD-10-CM | POA: Diagnosis not present

## 2024-05-09 DIAGNOSIS — R2689 Other abnormalities of gait and mobility: Secondary | ICD-10-CM

## 2024-05-09 DIAGNOSIS — M5416 Radiculopathy, lumbar region: Secondary | ICD-10-CM

## 2024-05-09 NOTE — Therapy (Signed)
 OUTPATIENT PHYSICAL THERAPY THORACOLUMBAR TREATMENT   Patient Name: Bill Ferrell MRN: [id number] DOB:1965/09/13, 58 y.o., male Today's Date: 05/09/2024  END OF SESSION:  PT End of Session - 05/09/24 1536     Visit Number 3    Number of Visits 17    Date for Recertification  06/27/24    PT Start Time 1515    PT Stop Time 1553    PT Time Calculation (min) 38 min    Activity Tolerance Patient tolerated treatment well    Behavior During Therapy [facility] for tasks assessed/performed            Past Medical History:  Diagnosis Date   Gout    Hypertension    History reviewed. No pertinent surgical history. Patient Active Problem List   Diagnosis Date Noted   Alcohol dependence with uncomplicated withdrawal (HCC) 10/08/2014   Substance induced mood disorder (HCC) 10/08/2014   Cocaine abuse (HCC) 10/08/2014    PCP: Rexanne Ingle, MD   REFERRING PROVIDER: Vernetta Lonni GRADE*   REFERRING DIAG: R20.0 (ICD-10-CM) - Left leg numbness   Rationale for Evaluation and Treatment: Rehabilitation  THERAPY DIAG:  Radiculopathy, lumbar region  Other low back pain  Other abnormalities of gait and mobility  ONSET DATE: 6 months ago  SUBJECTIVE:                                                                                                                                                                                           SUBJECTIVE STATEMENT: Pt states the pain and numbness in the left hip  come and go.     Eval-Couple years ago the LLE and groin started hurting.  It has progressed.  States he also feels numbness that is worse with standing.  Does not feel it in the toes.  Started using a cane a week or 2 ago-to  keep the pressure off.  PERTINENT HISTORY:  No spinal surgeries.    PAIN:  Are you having pain? Yes: NPRS scale: 5/10-10/10 Pain location: L groin, buttuck Pain description: stabbing, tingling Aggravating factors: standing, sitting, sleeping on the L  side Relieving factors: stretching  PRECAUTIONS: None  RED FLAGS: Bowel or bladder incontinence: No   WEIGHT BEARING RESTRICTIONS: No  FALLS:  Has patient fallen in last 6 months? No  LIVING ENVIRONMENT: Lives with: lives with their family Lives in: House/apartment Stairs: Yes: Internal: 12 steps; has rails Has following equipment at home: Single point cane  OCCUPATION: meat cutter   PLOF: Independent  PATIENT GOALS: Decrease pain.  NEXT MD VISIT: unknown  OBJECTIVE:  Note: Objective measures were completed at Evaluation unless  otherwise noted.  DIAGNOSTIC FINDINGS:  04/28/24  Xray Lumbar spine 2 views: No spondylolisthesis no acute fractures.  This space  overall well-maintained except for L4-5 S1 which is slightly narrowed.   Minimal anterior vertebral spurring at multiple levels otherwise no  significant arthritic changes.  Loss of lordotic curvature.   PATIENT SURVEYS:  PSFS: THE PATIENT SPECIFIC FUNCTIONAL SCALE  Place score of 0-10 (0 = unable to perform activity and 10 = able to perform activity at the same level as before injury or problem)  Activity Date: 05/02/24    standing 5    2.walking 5    3.sleeping on L 3    4.      Total Score 4.33      Total Score = Sum of activity scores/number of activities  Minimally Detectable Change: 3 points (for single activity); 2 points (for average score)  [facility] (nd). The Patient Specific Functional Scale . Retrieved from [email or web address]   COGNITION: Overall cognitive status: Within functional limits for tasks assessed     SENSATION:NT   MUSCLE LENGTH: Hamstrings: Right mod to severe restriction   POSTURE: LE ER, flattened lordosis  PALPATION: 1+ tenderness R PVM  LUMBAR ROM:   AROM eval  Flexion 50%  Extension 50%  Right lateral flexion 100%  Left lateral flexion 50%P  Right rotation 75%  Left rotation 75%   (Blank rows =  not tested)  LOWER EXTREMITY ROM:     Active /Passive Right eval Left eval  Hip flexion    Hip extension    Hip abduction  25 P groin  Hip adduction    Hip internal rotation    Hip external rotation    Knee flexion    Knee extension    Ankle dorsiflexion    Ankle plantarflexion    Ankle inversion    Ankle eversion     (Blank rows = not tested)  LOWER EXTREMITY MMT:    MMT Right eval Left eval  Hip flexion  4+  Hip extension    Hip abduction    Hip adduction    Hip internal rotation    Hip external rotation    Knee flexion    Knee extension  4+  Ankle dorsiflexion  4+  Ankle plantarflexion    Ankle inversion    Ankle eversion     (Blank rows = not tested)  LUMBAR SPECIAL TESTS:  Straight leg raise test: Negative  FUNCTIONAL TESTS:  5 times sit to stand: 23 sec  GAIT: Distance walked: Restaurant manager, fast food utilized: Single point cane Level of assistance: Complete Independence Comments: -  TREATMENT DATE: L sciatica 05/09/24 Rec bike level 2 8 min Leg Press 75 # 3x10 Sit to stand 15# 2x10 Prone props 30 sec up x5 Prone hip ext 2x10 bilateral Bridges 2x10 Supine Marching 2x20  05/03/24 Nustep level 3   Sit to stand 15# 2x5 Prone props 30 sec up x5 Prone hip ext 2x10 bilateral Bridges 2x10 Supine Marching 2x20    05/02/24 Initial evaluation of lumbar spine completed followed by instruction and trial set of HEP.  PATIENT EDUCATION:  Education details: HEP Person educated: Patient Education method: Explanation, Demonstration, Actor cues, and Verbal cues Education comprehension: verbalized understanding, returned demonstration, verbal cues required, and tactile cues required  HOME EXERCISE PROGRAM: Access Code: [email or web address] URL: [email or web address] Date: 05/03/2024 Prepared by: Burnard Meth  Exercises - Supine Bridge  - 2 x daily - 7 x weekly - 2 sets - 10 reps - Supine March  - 2 x daily - 7 x weekly - 2 sets - 10 reps - Supine Lower Trunk Rotation  - 2 x daily - 7 x weekly - 2 sets - 20 reps - Supine Single Knee to Chest Stretch  - 2 x daily - 7 x weekly - 1 sets - 3 reps - 25 hold - Prone Press Up On Elbows  - 2 x daily - 7 x weekly - 1 sets - 10 reps - 20 hold ASSESSMENT:  CLINICAL IMPRESSION: Continued decreased symptoms noted in prone.    OBJECTIVE IMPAIRMENTS: Abnormal gait, decreased ROM, decreased strength, increased muscle spasms, impaired flexibility, impaired sensation, and pain.   ACTIVITY LIMITATIONS: standing, squatting, sleeping, and transfers  PARTICIPATION LIMITATIONS: community activity and occupation  PERSONAL FACTORS: Fitness are also affecting patient's functional outcome.   REHAB POTENTIAL: Good  CLINICAL DECISION MAKING: Stable/uncomplicated  EVALUATION COMPLEXITY: Moderate   GOALS: Goals reviewed with patient? Yes  SHORT TERM GOALS: Target date: 05/23/2024    Patient to be independent with HEP. Baseline: Goal status: INITIAL  2.  Decrease pain by 1 level. Baseline:  Goal status: INITIAL  LONG TERM GOALS: Target date: 06/27/2024 8 weeks    Patient to be independent with self progressive HEP at time of discharge. Baseline:  Goal status: INITIAL  2.  Increase active range of motion by 25%. Baseline:  Goal status: INITIAL  3.  Increase strength of lower extremities and spinal stabilizers by half grade to 1 full grade by discharge. Baseline:  Goal status: INITIAL  4.  Improve numbness in left lower extremity by 50%. Baseline:  Goal status: INITIAL  5.  Improve patient's score on PSFS by greater than 3 points to show detectable change. Baseline:  Goal status: INITIAL  6.  Able to ambulate without AD community distances with no pain or symptoms. Baseline:  Goal status: INITIAL  PLAN:  PT FREQUENCY:  2x/week  PT DURATION: 8 weeks  PLANNED INTERVENTIONS: 97164- PT Re-evaluation, 97110-Therapeutic exercises, 97530- Therapeutic activity, 97112- Neuromuscular re-education, 97535- Self Care, [id number]- Manual therapy, [phone]- Aquatic Therapy, Patient/Family education, Balance training, Spinal mobilization, Cryotherapy, and Moist heat.  PLAN FOR NEXT SESSION: Continue with stabilization exercises.   Burnard CHRISTELLA Meth, PT 05/09/2024, 5:25 PM

## 2024-05-13 ENCOUNTER — Ambulatory Visit: Admitting: Rehabilitative and Restorative Service Providers"

## 2024-05-13 ENCOUNTER — Encounter: Payer: Self-pay | Admitting: Rehabilitative and Restorative Service Providers"

## 2024-05-13 DIAGNOSIS — R2689 Other abnormalities of gait and mobility: Secondary | ICD-10-CM

## 2024-05-13 DIAGNOSIS — M5459 Other low back pain: Secondary | ICD-10-CM

## 2024-05-13 DIAGNOSIS — M5416 Radiculopathy, lumbar region: Secondary | ICD-10-CM

## 2024-05-13 NOTE — Therapy (Signed)
 OUTPATIENT PHYSICAL THERAPY TREATMENT   Patient Name: Bill Ferrell MRN: [id number] DOB:[birth year], 58 y.o., male Today's Date: 05/13/2024  END OF SESSION:  PT End of Session - 05/13/24 1138     Visit Number 4    Number of Visits 17    Date for Recertification  06/27/24    Authorization Type [employer]    PT Start Time 1138    PT Stop Time 1217    PT Time Calculation (min) 39 min    Activity Tolerance Patient tolerated treatment well    Behavior During Therapy [facility] for tasks assessed/performed             Past Medical History:  Diagnosis Date   Gout    Hypertension    History reviewed. No pertinent surgical history. Patient Active Problem List   Diagnosis Date Noted   Alcohol dependence with uncomplicated withdrawal (HCC) 10/08/2014   Substance induced mood disorder (HCC) 10/08/2014   Cocaine abuse (HCC) 10/08/2014    PCP: Rexanne Ingle, MD   REFERRING PROVIDER: Gretta Bertrum ORN, PA-C   REFERRING DIAG: R20.0 (ICD-10-CM) - Left leg numbness   Rationale for Evaluation and Treatment: Rehabilitation  THERAPY DIAG:  Radiculopathy, lumbar region  Other low back pain  Other abnormalities of gait and mobility  ONSET DATE: 6 months ago  SUBJECTIVE:                                                                                                                                                                                           SUBJECTIVE STATEMENT: Pt indicated no complaints upon arrival.  Pt indicated feeling shooting up of symptoms at times.  Noted with walking in morning as well as prolonged sitting. Pt indicated getting back to the gym this week, avoiding some lifting.    PERTINENT HISTORY:  Eval-Couple years ago the LLE and groin started hurting.  It has progressed.  States he also feels numbness that is worse with standing.  Does not feel it in the toes.  Started using a cane a week or 2 ago-to  keep the pressure off.  No spinal surgeries.    PAIN:    NPRS scale: up to 5/10 in last day or two.  Pain location: L groin, buttuck Pain description: stabbing, tingling Aggravating factors: walking , prolonged sitting.  Relieving factors: stretching  PRECAUTIONS: None  RED FLAGS: Bowel or bladder incontinence: No   WEIGHT BEARING RESTRICTIONS: No  FALLS:  Has patient fallen in last 6 months? No  LIVING ENVIRONMENT: Lives with: lives with their family Lives in: House/apartment Stairs: Yes: Internal: 12 steps; has rails Has following equipment at  home: Single point cane  OCCUPATION: meat cutter   PLOF: Independent  PATIENT GOALS: Decrease pain.  NEXT MD VISIT: unknown  OBJECTIVE:  Note: Objective measures were completed at Evaluation unless otherwise noted.  DIAGNOSTIC FINDINGS:  04/28/24  Xray Lumbar spine 2 views: No spondylolisthesis no acute fractures.  This space  overall well-maintained except for L4-5 S1 which is slightly narrowed.   Minimal anterior vertebral spurring at multiple levels otherwise no  significant arthritic changes.  Loss of lordotic curvature.   PATIENT SURVEYS:  PSFS: THE PATIENT SPECIFIC FUNCTIONAL SCALE  Place score of 0-10 (0 = unable to perform activity and 10 = able to perform activity at the same level as before injury or problem)  Activity Date: 05/02/24    standing 5    2.walking 5    3.sleeping on L 3    4.      Total Score 4.33      Total Score = Sum of activity scores/number of activities  Minimally Detectable Change: 3 points (for single activity); 2 points (for average score)  [facility] (nd). The Patient Specific Functional Scale . Retrieved from [email or web address]   COGNITION: 05/02/2024 Overall cognitive status: Within functional limits for tasks assessed     SENSATION: 05/02/2024 NT   MUSCLE LENGTH: 05/02/2024 Hamstrings: Right mod to severe restriction   POSTURE:  05/02/2024 LE ER, flattened  lordosis  PALPATION: 05/02/2024 1+ tenderness R PVM  LUMBAR ROM:   AROM Eval 05/02/2024 05/13/2024  Flexion 50%   Extension 50% 50% on 1st rep  Repeated x 5 in standing, improved to 75% with reduced leg numbness after sitting.   Right lateral flexion 100%   Left lateral flexion 50%P   Right rotation 75%   Left rotation 75%    (Blank rows = not tested)  LOWER EXTREMITY ROM:     Active /Passive Right eval Left Eval 05/02/2024  Hip flexion    Hip extension    Hip abduction  25 P groin  Hip adduction    Hip internal rotation    Hip external rotation    Knee flexion    Knee extension    Ankle dorsiflexion    Ankle plantarflexion    Ankle inversion    Ankle eversion     (Blank rows = not tested)  LOWER EXTREMITY MMT:    MMT Right eval Left Eval 05/02/2024  Hip flexion  4+  Hip extension    Hip abduction    Hip adduction    Hip internal rotation    Hip external rotation    Knee flexion    Knee extension  4+  Ankle dorsiflexion  4+  Ankle plantarflexion    Ankle inversion    Ankle eversion     (Blank rows = not tested)  LUMBAR SPECIAL TESTS:  05/02/2024 Straight leg raise test: Negative  FUNCTIONAL TESTS:  05/02/2024 5 times sit to stand: 23 sec  GAIT: 05/02/2024 Distance walked: [employer] device utilized: Single point cane Level of assistance: Complete Independence Comments: -                 TREATMENT        DATE:05/13/2024 Therex: Recumbent bike lvl 5 seat 9 - 10 mins for ROM, endurance.  Standing lumbar extension AROM 2 x 5  Discussion about return to gym activity with pain/symptom monitoring scale.    Neuro Re-ed (muscle coordination, postural support) Standing tband rows c scapular retraction 2 x 15 with core  stability activation.  Standing GH ext bilateral with 2 sec pause 2 x 15 blue band  Anti rotation side stepping double blue band 5 sec hold x 10 bilaterally      TREATMENT        DATE:05/09/24 Rec bike  level 2 8 min Leg Press 75 # 3x10 Sit to stand 15# 2x10 Prone props 30 sec up x5 Prone hip ext 2x10 bilateral Bridges 2x10 Supine Marching 2x20  TREATMENT        DATE:05/03/24 Nustep level 3   Sit to stand 15# 2x5 Prone props 30 sec up x5 Prone hip ext 2x10 bilateral Bridges 2x10 Supine Marching 2x20    PATIENT EDUCATION:  05/03/2024 Education details: HEP Person educated: Patient Education method: Programmer, multimedia, Demonstration, Actor cues, and Verbal cues Education comprehension: verbalized understanding, returned demonstration, verbal cues required, and tactile cues required  HOME EXERCISE PROGRAM: Access Code: [email or web address] URL: [email or web address] Date: 05/03/2024 Prepared by: Burnard Meth  Exercises - Supine Bridge  - 2 x daily - 7 x weekly - 2 sets - 10 reps - Supine March  - 2 x daily - 7 x weekly - 2 sets - 10 reps - Supine Lower Trunk Rotation  - 2 x daily - 7 x weekly - 2 sets - 20 reps - Supine Single Knee to Chest Stretch  - 2 x daily - 7 x weekly - 1 sets - 3 reps - 25 hold - Prone Press Up On Elbows  - 2 x daily - 7 x weekly - 1 sets - 10 reps - 20 hold ASSESSMENT:  CLINICAL IMPRESSION: Some evident within clinic of directional specific for extension with some reduction noted in standing extension today.  Reviewed return to workout activity with pain monitoring scale review with focus of keeping symptoms 3/10 or less in activity return.   OBJECTIVE IMPAIRMENTS: Abnormal gait, decreased ROM, decreased strength, increased muscle spasms, impaired flexibility, impaired sensation, and pain.   ACTIVITY LIMITATIONS: standing, squatting, sleeping, and transfers  PARTICIPATION LIMITATIONS: community activity and occupation  PERSONAL FACTORS: Fitness are also affecting patient's functional outcome.   REHAB POTENTIAL: Good  CLINICAL DECISION MAKING: Stable/uncomplicated  EVALUATION COMPLEXITY: Moderate   GOALS: Goals reviewed with patient?  Yes  SHORT TERM GOALS: Target date: 05/23/2024    Patient to be independent with HEP. Baseline: Goal status: on going 05/13/2024  2.  Decrease pain by 1 level. Baseline:  Goal status: on going 05/13/2024  LONG TERM GOALS: Target date: 06/27/2024 8 weeks    Patient to be independent with self progressive HEP at time of discharge. Baseline:  Goal status: INITIAL  2.  Increase active range of motion by 25%. Baseline:  Goal status: INITIAL  3.  Increase strength of lower extremities and spinal stabilizers by half grade to 1 full grade by discharge. Baseline:  Goal status: INITIAL  4.  Improve numbness in left lower extremity by 50%. Baseline:  Goal status: INITIAL  5.  Improve patient's score on PSFS by greater than 3 points to show detectable change. Baseline:  Goal status: INITIAL  6.  Able to ambulate without AD community distances with no pain or symptoms. Baseline:  Goal status: INITIAL  PLAN:  PT FREQUENCY: 2x/week  PT DURATION: 8 weeks  PLANNED INTERVENTIONS: 97164- PT Re-evaluation, 97110-Therapeutic exercises, 97530- Therapeutic activity, 97112- Neuromuscular re-education, 97535- Self Care, [id number]- Manual therapy, [phone]- Aquatic Therapy, Patient/Family education, Balance training, Spinal mobilization, Cryotherapy, and Moist heat.  PLAN FOR NEXT SESSION: Extension use. Progressive return to workout  routine as able.    Ozell Silvan, PT, DPT, OCS, ATC 05/13/24  12:20 PM

## 2024-05-17 ENCOUNTER — Ambulatory Visit (INDEPENDENT_AMBULATORY_CARE_PROVIDER_SITE_OTHER): Admitting: Rehabilitative and Restorative Service Providers"

## 2024-05-17 ENCOUNTER — Encounter: Payer: Self-pay | Admitting: Rehabilitative and Restorative Service Providers"

## 2024-05-17 DIAGNOSIS — M5459 Other low back pain: Secondary | ICD-10-CM

## 2024-05-17 DIAGNOSIS — R2689 Other abnormalities of gait and mobility: Secondary | ICD-10-CM | POA: Diagnosis not present

## 2024-05-17 DIAGNOSIS — M5416 Radiculopathy, lumbar region: Secondary | ICD-10-CM

## 2024-05-17 NOTE — Therapy (Signed)
 OUTPATIENT PHYSICAL THERAPY TREATMENT   Patient Name: Bill Ferrell MRN: [id number] DOB:January 31, 1966, 58 y.o., male Today's Date: 05/17/2024  END OF SESSION:  PT End of Session - 05/17/24 0755     Visit Number 5    Number of Visits 17    Date for Recertification  06/27/24    Authorization Type [employer]    PT Start Time 0755    PT Stop Time 0834    PT Time Calculation (min) 39 min    Activity Tolerance Patient tolerated treatment well    Behavior During Therapy [facility] for tasks assessed/performed              Past Medical History:  Diagnosis Date   Gout    Hypertension    History reviewed. No pertinent surgical history. Patient Active Problem List   Diagnosis Date Noted   Alcohol dependence with uncomplicated withdrawal (HCC) 10/08/2014   Substance induced mood disorder (HCC) 10/08/2014   Cocaine abuse (HCC) 10/08/2014    PCP: Rexanne Ingle, MD   REFERRING PROVIDER: Gretta Bertrum ORN, PA-C   REFERRING DIAG: R20.0 (ICD-10-CM) - Left leg numbness   Rationale for Evaluation and Treatment: Rehabilitation  THERAPY DIAG:  Radiculopathy, lumbar region  Other low back pain  Other abnormalities of gait and mobility  ONSET DATE: 6 months ago  SUBJECTIVE:                                                                                                                                                                                           SUBJECTIVE STATEMENT: Pt indicated a little stiff this morning. Pt indicated feeling ok after last visit.    PERTINENT HISTORY:  Eval-Couple years ago the LLE and groin started hurting.  It has progressed.  States he also feels numbness that is worse with standing.  Does not feel it in the toes.  Started using a cane a week or 2 ago-to  keep the pressure off.  No spinal surgeries.    PAIN:   NPRS scale: tightness reported upon arrival.  Pain location: L groin, buttuck Pain description: stabbing, tingling Aggravating factors:  walking , prolonged sitting.  Relieving factors: stretching  PRECAUTIONS: None  RED FLAGS: Bowel or bladder incontinence: No   WEIGHT BEARING RESTRICTIONS: No  FALLS:  Has patient fallen in last 6 months? No  LIVING ENVIRONMENT: Lives with: lives with their family Lives in: House/apartment Stairs: Yes: Internal: 12 steps; has rails Has following equipment at home: Single point cane  OCCUPATION: meat cutter   PLOF: Independent  PATIENT GOALS: Decrease pain.  NEXT MD VISIT: unknown  OBJECTIVE:  Note: Objective measures  were completed at Evaluation unless otherwise noted.  DIAGNOSTIC FINDINGS:  04/28/24  Xray Lumbar spine 2 views: No spondylolisthesis no acute fractures.  This space  overall well-maintained except for L4-5 S1 which is slightly narrowed.   Minimal anterior vertebral spurring at multiple levels otherwise no  significant arthritic changes.  Loss of lordotic curvature.   PATIENT SURVEYS:  PSFS: THE PATIENT SPECIFIC FUNCTIONAL SCALE  Place score of 0-10 (0 = unable to perform activity and 10 = able to perform activity at the same level as before injury or problem)  Activity Date: 05/02/24    standing 5    2.walking 5    3.sleeping on L 3    4.      Total Score 4.33      Total Score = Sum of activity scores/number of activities  Minimally Detectable Change: 3 points (for single activity); 2 points (for average score)  [facility] (nd). The Patient Specific Functional Scale . Retrieved from [email or web address]   COGNITION: 05/02/2024 Overall cognitive status: Within functional limits for tasks assessed     SENSATION: 05/02/2024 NT   MUSCLE LENGTH: 05/02/2024 Hamstrings: Right mod to severe restriction   POSTURE:  05/02/2024 LE ER, flattened lordosis  PALPATION: 05/02/2024 1+ tenderness R PVM  LUMBAR ROM:   AROM Eval 05/02/2024 05/13/2024  Flexion 50%   Extension 50%  50% on 1st rep  Repeated x 5 in standing, improved to 75% with reduced leg numbness after sitting.   Right lateral flexion 100%   Left lateral flexion 50%P   Right rotation 75%   Left rotation 75%    (Blank rows = not tested)  LOWER EXTREMITY ROM:     Active /Passive Right eval Left Eval 05/02/2024  Hip flexion    Hip extension    Hip abduction  25 P groin  Hip adduction    Hip internal rotation    Hip external rotation    Knee flexion    Knee extension    Ankle dorsiflexion    Ankle plantarflexion    Ankle inversion    Ankle eversion     (Blank rows = not tested)  LOWER EXTREMITY MMT:    MMT Right eval Left Eval 05/02/2024  Hip flexion  4+  Hip extension    Hip abduction    Hip adduction    Hip internal rotation    Hip external rotation    Knee flexion    Knee extension  4+  Ankle dorsiflexion  4+  Ankle plantarflexion    Ankle inversion    Ankle eversion     (Blank rows = not tested)  LUMBAR SPECIAL TESTS:  05/02/2024 Straight leg raise test: Negative  FUNCTIONAL TESTS:  05/02/2024 5 times sit to stand: 23 sec  GAIT: 05/17/2024: Independent ambulation into clinic.   05/02/2024 Distance walked: [employer] device utilized: Single point cane Level of assistance: Complete Independence Comments: -                 TREATMENT        DATE:05/13/2024 Therex: Recumbent bike lvl 5 seat 9 - 10 mins for ROM, endurance.  Leg press double leg 100 lbs x 20 with slow lowering focus.  Leg press single leg 50 lbs x 15 with slow lowering focus.  Standing lumbar extension AROM 2 x 5  Prone press up 2 sec hold on table with pillows under abdomen x 10    Neuro Re-ed (muscle coordination, postural support) Prone opposite  arm/leg lift 3 sec hold x 10 bilateral, pillows under abdomen Standing GH ext bilateral with 5 sec pause 10 blue band  Anti rotation side stepping double blue band 5 sec hold x 10 bilaterally    TREATMENT         DATE:05/09/24 Rec bike level 2 8 min Leg Press 75 # 3x10 Sit to stand 15# 2x10 Prone props 30 sec up x5 Prone hip ext 2x10 bilateral Bridges 2x10 Supine Marching 2x20  TREATMENT        DATE:05/03/24 Nustep level 3   Sit to stand 15# 2x5 Prone props 30 sec up x5 Prone hip ext 2x10 bilateral Bridges 2x10 Supine Marching 2x20    PATIENT EDUCATION:  05/03/2024 Education details: HEP Person educated: Patient Education method: Programmer, multimedia, Demonstration, Actor cues, and Verbal cues Education comprehension: verbalized understanding, returned demonstration, verbal cues required, and tactile cues required  HOME EXERCISE PROGRAM: Access Code: [email or web address] URL: [email or web address] Date: 05/03/2024 Prepared by: Burnard Meth  Exercises - Supine Bridge  - 2 x daily - 7 x weekly - 2 sets - 10 reps - Supine March  - 2 x daily - 7 x weekly - 2 sets - 10 reps - Supine Lower Trunk Rotation  - 2 x daily - 7 x weekly - 2 sets - 20 reps - Supine Single Knee to Chest Stretch  - 2 x daily - 7 x weekly - 1 sets - 3 reps - 25 hold - Prone Press Up On Elbows  - 2 x daily - 7 x weekly - 1 sets - 10 reps - 20 hold ASSESSMENT:  CLINICAL IMPRESSION: Continued inclusion of extension mobility with graded return to gym based activity for general strengthening.  Focus on core stability in activity.  Continued skilled PT services indicated at this time.   OBJECTIVE IMPAIRMENTS: Abnormal gait, decreased ROM, decreased strength, increased muscle spasms, impaired flexibility, impaired sensation, and pain.   ACTIVITY LIMITATIONS: standing, squatting, sleeping, and transfers  PARTICIPATION LIMITATIONS: community activity and occupation  PERSONAL FACTORS: Fitness are also affecting patient's functional outcome.   REHAB POTENTIAL: Good  CLINICAL DECISION MAKING: Stable/uncomplicated  EVALUATION COMPLEXITY: Moderate   GOALS: Goals reviewed with patient? Yes  SHORT TERM GOALS:  Target date: 05/23/2024    Patient to be independent with HEP. Baseline: Goal status: on going 05/13/2024  2.  Decrease pain by 1 level. Baseline:  Goal status: on going 05/13/2024  LONG TERM GOALS: Target date: 06/27/2024 8 weeks    Patient to be independent with self progressive HEP at time of discharge. Baseline:  Goal status: INITIAL  2.  Increase active range of motion by 25%. Baseline:  Goal status: INITIAL  3.  Increase strength of lower extremities and spinal stabilizers by half grade to 1 full grade by discharge. Baseline:  Goal status: INITIAL  4.  Improve numbness in left lower extremity by 50%. Baseline:  Goal status: INITIAL  5.  Improve patient's score on PSFS by greater than 3 points to show detectable change. Baseline:  Goal status: INITIAL  6.  Able to ambulate without AD community distances with no pain or symptoms. Baseline:  Goal status: INITIAL  PLAN:  PT FREQUENCY: 2x/week  PT DURATION: 8 weeks  PLANNED INTERVENTIONS: 97164- PT Re-evaluation, 97110-Therapeutic exercises, 97530- Therapeutic activity, 97112- Neuromuscular re-education, 97535- Self Care, 02859- Manual therapy, [phone]- Aquatic Therapy, Patient/Family education, Balance training, Spinal mobilization, Cryotherapy, and Moist heat.  PLAN FOR NEXT SESSION: Reassess PSFS, look at LTGs.    Ozell Silvan,  PT, DPT, OCS, ATC 05/17/24  8:33 AM

## 2024-05-19 NOTE — Therapy (Signed)
 OUTPATIENT PHYSICAL THERAPY TREATMENT   Patient Name: Bill Ferrell MRN: [id number] DOB:[birth year], 58 y.o., male Today's Date: 05/20/2024  END OF SESSION:  PT End of Session - 05/20/24 0930     Visit Number 6    Number of Visits 17    Date for Recertification  06/27/24    Authorization Type [employer]    PT Start Time [phone]    PT Stop Time 1003    PT Time Calculation (min) 38 min    Activity Tolerance Patient tolerated treatment well    Behavior During Therapy [facility] for tasks assessed/performed               Past Medical History:  Diagnosis Date   Gout    Hypertension    History reviewed. No pertinent surgical history. Patient Active Problem List   Diagnosis Date Noted   Alcohol dependence with uncomplicated withdrawal (HCC) 10/08/2014   Substance induced mood disorder (HCC) 10/08/2014   Cocaine abuse (HCC) 10/08/2014    PCP: Rexanne Ingle, MD   REFERRING PROVIDER: Gretta Bertrum ORN, PA-C   REFERRING DIAG: R20.0 (ICD-10-CM) - Left leg numbness   Rationale for Evaluation and Treatment: Rehabilitation  THERAPY DIAG:  Radiculopathy, lumbar region  Other low back pain  Other abnormalities of gait and mobility  ONSET DATE: 6 months ago  SUBJECTIVE:                                                                                                                                                                                           SUBJECTIVE STATEMENT: Pt states he notices a little less LE numbness.  PERTINENT HISTORY:  Eval-Couple years ago the LLE and groin started hurting.  It has progressed.  States he also feels numbness that is worse with standing.  Does not feel it in the toes.  Started using a cane a week or 2 ago-to  keep the pressure off.  No spinal surgeries.    PAIN:   NPRS scale: 5/10 fluctuating Pain location: L groin, buttuck Pain description: stabbing, tingling Aggravating factors: walking , prolonged sitting.  Relieving factors:  stretching  PRECAUTIONS: None  RED FLAGS: Bowel or bladder incontinence: No   WEIGHT BEARING RESTRICTIONS: No  FALLS:  Has patient fallen in last 6 months? No  LIVING ENVIRONMENT: Lives with: lives with their family Lives in: House/apartment Stairs: Yes: Internal: 12 steps; has rails Has following equipment at home: Single point cane  OCCUPATION: meat cutter   PLOF: Independent  PATIENT GOALS: Decrease pain.  NEXT MD VISIT: unknown  OBJECTIVE:  Note: Objective measures were completed at Evaluation unless otherwise noted.  DIAGNOSTIC  FINDINGS:  04/28/24  Xray Lumbar spine 2 views: No spondylolisthesis no acute fractures.  This space  overall well-maintained except for L4-5 S1 which is slightly narrowed.   Minimal anterior vertebral spurring at multiple levels otherwise no  significant arthritic changes.  Loss of lordotic curvature.   PATIENT SURVEYS:  PSFS: THE PATIENT SPECIFIC FUNCTIONAL SCALE  Place score of 0-10 (0 = unable to perform activity and 10 = able to perform activity at the same level as before injury or problem)  Activity Date: 05/02/24    standing 5    2.walking 5    3.sleeping on L 3    4.      Total Score 4.33      Total Score = Sum of activity scores/number of activities  Minimally Detectable Change: 3 points (for single activity); 2 points (for average score)  [facility] (nd). The Patient Specific Functional Scale . Retrieved from [email or web address]   COGNITION: 05/02/2024 Overall cognitive status: Within functional limits for tasks assessed     SENSATION: 05/02/2024 NT   MUSCLE LENGTH: 05/02/2024 Hamstrings: Right mod to severe restriction   POSTURE:  05/02/2024 LE ER, flattened lordosis  PALPATION: 05/02/2024 1+ tenderness R PVM  LUMBAR ROM:   AROM Eval 05/02/2024 05/13/2024  Flexion 50%   Extension 50% 50% on 1st rep  Repeated x 5 in standing,  improved to 75% with reduced leg numbness after sitting.   Right lateral flexion 100%   Left lateral flexion 50%P   Right rotation 75%   Left rotation 75%    (Blank rows = not tested)  LOWER EXTREMITY ROM:     Active /Passive Right eval Left Eval 05/02/2024  Hip flexion    Hip extension    Hip abduction  25 P groin  Hip adduction    Hip internal rotation    Hip external rotation    Knee flexion    Knee extension    Ankle dorsiflexion    Ankle plantarflexion    Ankle inversion    Ankle eversion     (Blank rows = not tested)  LOWER EXTREMITY MMT:    MMT Right eval Left Eval 05/02/2024  Hip flexion  4+  Hip extension    Hip abduction    Hip adduction    Hip internal rotation    Hip external rotation    Knee flexion    Knee extension  4+  Ankle dorsiflexion  4+  Ankle plantarflexion    Ankle inversion    Ankle eversion     (Blank rows = not tested)  LUMBAR SPECIAL TESTS:  05/02/2024 Straight leg raise test: Negative  FUNCTIONAL TESTS:  05/02/2024 5 times sit to stand: 23 sec  GAIT: 05/17/2024: Independent ambulation into clinic.   05/02/2024 Distance walked: [employer] device utilized: Single point cane Level of assistance: Complete Independence Comments: -            TREATMENT        DATE: 05/20/24 Therex: Recumbent bike lvl 5 seat 9 - 10 mins for ROM, endurance.  Leg press double leg 112 lbs 2 x 15 with slow lowering focus.  Leg press single leg 50 lbs x 15 with slow lowering focus.  Standing lumbar extension AROM 2 x 10 Prone press up 5 sec hold on table with pillows under abdomen x 10    Neuro Re-ed (muscle coordination, postural support) Prone opposite arm/leg lift 3 sec hold x 10 bilateral, pillows under abdomen Standing GH  ext bilateral on wall /hitchhikers 2x10 Sit to stand 15# 2x10     TREATMENT        DATE:05/13/2024 Therex: Recumbent bike lvl 5 seat 9 - 10 mins for ROM, endurance.  Leg press double leg 100 lbs x  20 with slow lowering focus.  Leg press single leg 50 lbs x 15 with slow lowering focus.  Standing lumbar extension AROM 2 x 5  Prone press up 2 sec hold on table with pillows under abdomen x 10    Neuro Re-ed (muscle coordination, postural support) Prone opposite arm/leg lift 3 sec hold x 10 bilateral, pillows under abdomen Standing GH ext bilateral with 5 sec pause 10 blue band  Anti rotation side stepping double blue band 5 sec hold x 10 bilaterally    TREATMENT        DATE:05/09/24 Rec bike level 2 8 min Leg Press 75 # 3x10 Sit to stand 15# 2x10 Prone props 30 sec up x5 Prone hip ext 2x10 bilateral Bridges 2x10 Supine Marching 2x20  TREATMENT        DATE:05/03/24 Nustep level 3   Sit to stand 15# 2x5 Prone props 30 sec up x5 Prone hip ext 2x10 bilateral Bridges 2x10 Supine Marching 2x20    PATIENT EDUCATION:  05/03/2024 Education details: HEP Person educated: Patient Education method: Programmer, multimedia, Demonstration, Actor cues, and Verbal cues Education comprehension: verbalized understanding, returned demonstration, verbal cues required, and tactile cues required  HOME EXERCISE PROGRAM: Access Code: [email or web address] URL: [email or web address] Date: 05/03/2024 Prepared by: Burnard Meth  Exercises - Supine Bridge  - 2 x daily - 7 x weekly - 2 sets - 10 reps - Supine March  - 2 x daily - 7 x weekly - 2 sets - 10 reps - Supine Lower Trunk Rotation  - 2 x daily - 7 x weekly - 2 sets - 20 reps - Supine Single Knee to Chest Stretch  - 2 x daily - 7 x weekly - 1 sets - 3 reps - 25 hold - Prone Press Up On Elbows  - 2 x daily - 7 x weekly - 1 sets - 10 reps - 20 hold ASSESSMENT:  CLINICAL IMPRESSION: Pt demonstrated increased strength with ability to increase resistance on leg press.  STG completed.  OBJECTIVE IMPAIRMENTS: Abnormal gait, decreased ROM, decreased strength, increased muscle spasms, impaired flexibility, impaired sensation, and pain.   ACTIVITY  LIMITATIONS: standing, squatting, sleeping, and transfers  PARTICIPATION LIMITATIONS: community activity and occupation  PERSONAL FACTORS: Fitness are also affecting patient's functional outcome.   REHAB POTENTIAL: Good  CLINICAL DECISION MAKING: Stable/uncomplicated  EVALUATION COMPLEXITY: Moderate   GOALS: Goals reviewed with patient? Yes  SHORT TERM GOALS: Target date: 05/23/2024    Patient to be independent with HEP. Baseline: Goal status: met 05/20/24  2.  Decrease pain by 1 level. Baseline:  Goal status: met 05/20/24  LONG TERM GOALS: Target date: 06/27/2024 8 weeks    Patient to be independent with self progressive HEP at time of discharge. Baseline:  Goal status: INITIAL  2.  Increase active range of motion by 25%. Baseline:  Goal status: INITIAL  3.  Increase strength of lower extremities and spinal stabilizers by half grade to 1 full grade by discharge. Baseline:  Goal status: INITIAL  4.  Improve numbness in left lower extremity by 50%. Baseline:  Goal status: INITIAL  5.  Improve patient's score on PSFS by greater than 3 points to show detectable change. Baseline:  Goal status: INITIAL  6.  Able to ambulate without AD community distances with no pain or symptoms. Baseline:  Goal status: INITIAL  PLAN:  PT FREQUENCY: 2x/week  PT DURATION: 8 weeks  PLANNED INTERVENTIONS: 97164- PT Re-evaluation, 97110-Therapeutic exercises, 97530- Therapeutic activity, 97112- Neuromuscular re-education, 97535- Self Care, [id number]- Manual therapy, [phone]- Aquatic Therapy, Patient/Family education, Balance training, Spinal mobilization, Cryotherapy, and Moist heat.  PLAN FOR NEXT SESSION: Continue with stabilization exercises.  Burnard Meth, PT 05/20/24  10:47 AM

## 2024-05-20 ENCOUNTER — Ambulatory Visit

## 2024-05-20 DIAGNOSIS — M5416 Radiculopathy, lumbar region: Secondary | ICD-10-CM

## 2024-05-20 DIAGNOSIS — R2689 Other abnormalities of gait and mobility: Secondary | ICD-10-CM

## 2024-05-20 DIAGNOSIS — M5459 Other low back pain: Secondary | ICD-10-CM

## 2024-05-24 ENCOUNTER — Ambulatory Visit: Admitting: Rehabilitative and Restorative Service Providers"

## 2024-05-24 ENCOUNTER — Encounter: Payer: Self-pay | Admitting: Rehabilitative and Restorative Service Providers"

## 2024-05-24 DIAGNOSIS — R2689 Other abnormalities of gait and mobility: Secondary | ICD-10-CM | POA: Diagnosis not present

## 2024-05-24 DIAGNOSIS — M5459 Other low back pain: Secondary | ICD-10-CM

## 2024-05-24 DIAGNOSIS — M5416 Radiculopathy, lumbar region: Secondary | ICD-10-CM

## 2024-05-24 NOTE — Therapy (Addendum)
 OUTPATIENT PHYSICAL THERAPY TREATMENT   Patient Name: Bill Ferrell MRN: [id number] DOB:Nov 19, 1965, 58 y.o., male Today's Date: 05/24/2024  END OF SESSION:  PT End of Session - 05/24/24 0838     Visit Number 7    Number of Visits 17    Date for Recertification  06/27/24    Authorization Type [employer]    PT Start Time 0840    PT Stop Time 0920    PT Time Calculation (min) 40 min    Activity Tolerance Patient tolerated treatment well    Behavior During Therapy [facility] for tasks assessed/performed                Past Medical History:  Diagnosis Date   Gout    Hypertension    History reviewed. No pertinent surgical history. Patient Active Problem List   Diagnosis Date Noted   Alcohol dependence with uncomplicated withdrawal (HCC) 10/08/2014   Substance induced mood disorder (HCC) 10/08/2014   Cocaine abuse (HCC) 10/08/2014    PCP: Rexanne Ingle, MD   REFERRING PROVIDER: Gretta Bertrum ORN, PA-C   REFERRING DIAG: R20.0 (ICD-10-CM) - Left leg numbness   Rationale for Evaluation and Treatment: Rehabilitation  THERAPY DIAG:  Radiculopathy, lumbar region  Other low back pain  Other abnormalities of gait and mobility  ONSET DATE: 6 months ago  SUBJECTIVE:                                                                                                                                                                                           SUBJECTIVE STATEMENT: Pt indicated feeling the numbness feeling form hip to lateral thigh off and on.  Can notice it with bike, driving/sitting.   Symptoms don't last very long.  Reported about 50% back to normal at this time.   PERTINENT HISTORY:  Eval-Couple years ago the LLE and groin started hurting.  It has progressed.  States he also feels numbness that is worse with standing.  Does not feel it in the toes.  Started using a cane a week or 2 ago-to  keep the pressure off.  No spinal surgeries.    PAIN:   NPRS scale: up to  5/10 Pain location: Lt hip/thigh Pain description: numb Aggravating factors: variable.  Relieving factors: stretching  PRECAUTIONS: None  RED FLAGS: Bowel or bladder incontinence: No   WEIGHT BEARING RESTRICTIONS: No  FALLS:  Has patient fallen in last 6 months? No  LIVING ENVIRONMENT: Lives with: lives with their family Lives in: House/apartment Stairs: Yes: Internal: 12 steps; has rails Has following equipment at home: Single point cane  OCCUPATION: meat cutter  PLOF: Independent  PATIENT GOALS: Decrease pain.  NEXT MD VISIT: unknown  OBJECTIVE:  Note: Objective measures were completed at Evaluation unless otherwise noted.  DIAGNOSTIC FINDINGS:  04/28/24  Xray Lumbar spine 2 views: No spondylolisthesis no acute fractures.  This space  overall well-maintained except for L4-5 S1 which is slightly narrowed.   Minimal anterior vertebral spurring at multiple levels otherwise no  significant arthritic changes.  Loss of lordotic curvature.   PATIENT SURVEYS:  PSFS: THE PATIENT SPECIFIC FUNCTIONAL SCALE  Place score of 0-10 (0 = unable to perform activity and 10 = able to perform activity at the same level as before injury or problem)  Activity Date: 05/02/24    standing 5    2.walking 5    3.sleeping on L 3    4.      Total Score 4.33      Total Score = Sum of activity scores/number of activities  Minimally Detectable Change: 3 points (for single activity); 2 points (for average score)  [facility] (nd). The Patient Specific Functional Scale . Retrieved from [email or web address]   COGNITION: 05/02/2024 Overall cognitive status: Within functional limits for tasks assessed     SENSATION: 05/02/2024 NT   MUSCLE LENGTH: 05/02/2024 Hamstrings: Right mod to severe restriction   POSTURE:  05/02/2024 LE ER, flattened lordosis  PALPATION: 05/24/2024: Trigger points noted in Lt glute med/min  with concordant hip symptoms noted.   05/02/2024 1+ tenderness R PVM  LUMBAR ROM:   AROM Eval 05/02/2024 05/13/2024  Flexion 50%   Extension 50% 50% on 1st rep  Repeated x 5 in standing, improved to 75% with reduced leg numbness after sitting.   Right lateral flexion 100%   Left lateral flexion 50%P   Right rotation 75%   Left rotation 75%    (Blank rows = not tested)  LOWER EXTREMITY ROM:     Active /Passive Right eval Left Eval 05/02/2024  Hip flexion    Hip extension    Hip abduction  25 P groin  Hip adduction    Hip internal rotation    Hip external rotation    Knee flexion    Knee extension    Ankle dorsiflexion    Ankle plantarflexion    Ankle inversion    Ankle eversion     (Blank rows = not tested)  LOWER EXTREMITY MMT:    MMT Right eval Left Eval 05/02/2024 Right 05/24/2024 Left 05/24/2024  Hip flexion  4+    Hip extension      Hip abduction   5/5 4+/5  Hip adduction      Hip internal rotation      Hip external rotation      Knee flexion      Knee extension  4+    Ankle dorsiflexion  4+    Ankle plantarflexion      Ankle inversion      Ankle eversion       (Blank rows = not tested)  LUMBAR SPECIAL TESTS:  05/02/2024 Straight leg raise test: Negative  FUNCTIONAL TESTS:  05/02/2024 5 times sit to stand: 23 sec  GAIT: 05/17/2024: Independent ambulation into clinic.   05/02/2024 Distance walked: [employer] device utilized: Single point cane Level of assistance: Complete Independence Comments: -              TREATMENT        DATE: 05/24/2024 Therex: Recumbent bike lvl 5 seat 9 - 10 mins for ROM, endurance. Sidelying  hip abduction SLR 2 x 10 - performed bilaterally   Supine bridge with blue band hip abduction hold 2-3 sec hold 2 x 10  Supine hooklying trunk rotation 15 sec x 3 bilateral Standing lumbar extension AROM x 5   Manual Percussive device to trigger points in Lt glute med/min     TREATMENT         DATE: 05/20/24 Therex: Recumbent bike lvl 5 seat 9 - 10 mins for ROM, endurance.  Leg press double leg 112 lbs 2 x 15 with slow lowering focus.  Leg press single leg 50 lbs x 15 with slow lowering focus.  Standing lumbar extension AROM 2 x 10 Prone press up 5 sec hold on table with pillows under abdomen x 10    Neuro Re-ed (muscle coordination, postural support) Prone opposite arm/leg lift 3 sec hold x 10 bilateral, pillows under abdomen Standing GH ext bilateral on wall /hitchhikers 2x10 Sit to stand 15# 2x10     TREATMENT        DATE:05/13/2024 Therex: Recumbent bike lvl 5 seat 9 - 10 mins for ROM, endurance.  Leg press double leg 100 lbs x 20 with slow lowering focus.  Leg press single leg 50 lbs x 15 with slow lowering focus.  Standing lumbar extension AROM 2 x 5  Prone press up 2 sec hold on table with pillows under abdomen x 10    Neuro Re-ed (muscle coordination, postural support) Prone opposite arm/leg lift 3 sec hold x 10 bilateral, pillows under abdomen Standing GH ext bilateral with 5 sec pause 10 blue band  Anti rotation side stepping double blue band 5 sec hold x 10 bilaterally    TREATMENT        DATE:05/09/24 Rec bike level 2 8 min Leg Press 75 # 3x10 Sit to stand 15# 2x10 Prone props 30 sec up x5 Prone hip ext 2x10 bilateral Bridges 2x10 Supine Marching 2x20   PATIENT EDUCATION:  05/03/2024 Education details: HEP Person educated: Patient Education method: Programmer, Multimedia, Demonstration, Actor cues, and Verbal cues Education comprehension: verbalized understanding, returned demonstration, verbal cues required, and tactile cues required  HOME EXERCISE PROGRAM: Access Code: [email or web address] URL: [email or web address] Date: 05/24/2024 Prepared by: Ozell Silvan  Exercises - Supine Bridge  - 2 x daily - 7 x weekly - 2 sets - 10 reps - Supine March  - 2 x daily - 7 x weekly - 2 sets - 10 reps - Supine Lower Trunk Rotation  - 2 x daily - 7  x weekly - 2 sets - 20 reps - Supine Single Knee to Chest Stretch  - 2 x daily - 7 x weekly - 1 sets - 3 reps - 25 hold - Prone Press Up On Elbows  - 2 x daily - 7 x weekly - 1 sets - 10 reps - 20 hold - Sidelying Hip Abduction  - 1 x daily - 7 x weekly - 3 sets - 10 reps - Standing Lumbar Extension  - 2-4 x daily - 7 x weekly - 1 sets - 5-10 reps ASSESSMENT:  CLINICAL IMPRESSION: Possible connection for lateral hip trigger points for lateral hip symptoms.  Discussed use of massage gun or tennis ball direction pressure to help reduce trigger points as well as strengthening benefits.   OBJECTIVE IMPAIRMENTS: Abnormal gait, decreased ROM, decreased strength, increased muscle spasms, impaired flexibility, impaired sensation, and pain.   ACTIVITY LIMITATIONS: standing, squatting, sleeping, and transfers  PARTICIPATION LIMITATIONS: community activity and occupation  PERSONAL FACTORS:  Fitness are also affecting patient's functional outcome.   REHAB POTENTIAL: Good  CLINICAL DECISION MAKING: Stable/uncomplicated  EVALUATION COMPLEXITY: Moderate   GOALS: Goals reviewed with patient? Yes  SHORT TERM GOALS: Target date: 05/23/2024    Patient to be independent with HEP. Baseline: Goal status: met 05/20/24  2.  Decrease pain by 1 level. Baseline:  Goal status: met 05/20/24  LONG TERM GOALS: Target date: 06/27/2024 8 weeks    Patient to be independent with self progressive HEP at time of discharge. Baseline:  Goal status: on going 05/24/2024  2.  Increase active range of motion by 25%. Baseline:  Goal status: Met 05/24/2024  3.  Increase strength of lower extremities and spinal stabilizers by half grade to 1 full grade by discharge. Baseline:  Goal status: on going 05/24/2024  4.  Improve numbness in left lower extremity by 50%. Baseline:  Goal status: on going 05/24/2024  5.  Improve patient's score on PSFS by greater than 3 points to show detectable change. Baseline:   Goal status: on going 05/24/2024  6.  Able to ambulate without AD community distances with no pain or symptoms. Baseline:  Goal status: on going 05/24/2024  PLAN:  PT FREQUENCY: 2x/week  PT DURATION: 8 weeks  PLANNED INTERVENTIONS: 97164- PT Re-evaluation, 97110-Therapeutic exercises, 97530- Therapeutic activity, 97112- Neuromuscular re-education, 97535- Self Care, [id number]- Manual therapy, [phone]- Aquatic Therapy, Patient/Family education, Balance training, Spinal mobilization, Cryotherapy, and Moist heat.  PLAN FOR NEXT SESSION: Check on trigger point release treatment.    Ozell Silvan, PT, DPT, OCS, ATC 05/24/24  9:15 AM

## 2024-05-25 NOTE — Therapy (Signed)
 OUTPATIENT PHYSICAL THERAPY TREATMENT   Patient Name: Bill Ferrell MRN: [id number] DOB:12/11/1965, 58 y.o., male Today's Date: 05/26/2024  END OF SESSION:  PT End of Session - 05/26/24 1233     Visit Number 8    Number of Visits 17    Date for Recertification  06/27/24    Authorization Type [employer]    PT Start Time 1100    PT Stop Time 1138    PT Time Calculation (min) 38 min    Activity Tolerance Patient tolerated treatment well    Behavior During Therapy WFL for tasks assessed/performed                 Past Medical History:  Diagnosis Date   Gout    Hypertension    No past surgical history on file. Patient Active Problem List   Diagnosis Date Noted   Alcohol dependence with uncomplicated withdrawal (HCC) 10/08/2014   Substance induced mood disorder (HCC) 10/08/2014   Cocaine abuse (HCC) 10/08/2014    PCP: Rexanne Ingle, MD   REFERRING PROVIDER: Gretta Bertrum ORN, PA-C   REFERRING DIAG: R20.0 (ICD-10-CM) - Left leg numbness   Rationale for Evaluation and Treatment: Rehabilitation  THERAPY DIAG:  Radiculopathy, lumbar region  Other low back pain  Other abnormalities of gait and mobility  ONSET DATE: 6 months ago  SUBJECTIVE:                                                                                                                                                                                           SUBJECTIVE STATEMENT: Pt reports less tingling. PERTINENT HISTORY:  Eval-Couple years ago the LLE and groin started hurting.  It has progressed.  States he also feels numbness that is worse with standing.  Does not feel it in the toes.  Started using a cane a week or 2 ago-to  keep the pressure off.  No spinal surgeries.    PAIN:  NPRS scale: up to 3/10 Pain location: Lt hip/thigh Pain description: numb Aggravating factors: variable.  Relieving factors: stretching  PRECAUTIONS: None  RED FLAGS: Bowel or bladder incontinence:  No   WEIGHT BEARING RESTRICTIONS: No  FALLS:  Has patient fallen in last 6 months? No  LIVING ENVIRONMENT: Lives with: lives with their family Lives in: House/apartment Stairs: Yes: Internal: 12 steps; has rails Has following equipment at home: Single point cane  OCCUPATION: meat cutter   PLOF: Independent  PATIENT GOALS: Decrease pain.  NEXT MD VISIT: unknown  OBJECTIVE:  Note: Objective measures were completed at Evaluation unless otherwise noted.  DIAGNOSTIC FINDINGS:  04/28/24  Xray Lumbar spine 2 views:  No spondylolisthesis no acute fractures.  This space  overall well-maintained except for L4-5 S1 which is slightly narrowed.   Minimal anterior vertebral spurring at multiple levels otherwise no  significant arthritic changes.  Loss of lordotic curvature.   PATIENT SURVEYS:  PSFS: THE PATIENT SPECIFIC FUNCTIONAL SCALE  Place score of 0-10 (0 = unable to perform activity and 10 = able to perform activity at the same level as before injury or problem)  Activity Date: 05/02/24    standing 5    2.walking 5    3.sleeping on L 3    4.      Total Score 4.33      Total Score = Sum of activity scores/number of activities  Minimally Detectable Change: 3 points (for single activity); 2 points (for average score)  [facility] (nd). The Patient Specific Functional Scale . Retrieved from [email or web address]   COGNITION: 05/02/2024 Overall cognitive status: Within functional limits for tasks assessed     SENSATION: 05/02/2024 NT   MUSCLE LENGTH: 05/02/2024 Hamstrings: Right mod to severe restriction   POSTURE:  05/02/2024 LE ER, flattened lordosis  PALPATION: 05/24/2024: Trigger points noted in Lt glute med/min with concordant hip symptoms noted.   05/02/2024 1+ tenderness R PVM  LUMBAR ROM:   AROM Eval 05/02/2024 05/13/2024  Flexion 50%   Extension 50% 50% on 1st rep  Repeated x 5 in  standing, improved to 75% with reduced leg numbness after sitting.   Right lateral flexion 100%   Left lateral flexion 50%P   Right rotation 75%   Left rotation 75%    (Blank rows = not tested)  LOWER EXTREMITY ROM:     Active /Passive Right eval Left Eval 05/02/2024  Hip flexion    Hip extension    Hip abduction  25 P groin  Hip adduction    Hip internal rotation    Hip external rotation    Knee flexion    Knee extension    Ankle dorsiflexion    Ankle plantarflexion    Ankle inversion    Ankle eversion     (Blank rows = not tested)  LOWER EXTREMITY MMT:    MMT Right eval Left Eval 05/02/2024 Right 05/24/2024 Left 05/24/2024  Hip flexion  4+    Hip extension      Hip abduction   5/5 4+/5  Hip adduction      Hip internal rotation      Hip external rotation      Knee flexion      Knee extension  4+    Ankle dorsiflexion  4+    Ankle plantarflexion      Ankle inversion      Ankle eversion       (Blank rows = not tested)  LUMBAR SPECIAL TESTS:  05/02/2024 Straight leg raise test: Negative  FUNCTIONAL TESTS:  05/02/2024 5 times sit to stand: 23 sec  GAIT: 05/17/2024: Independent ambulation into clinic.   05/02/2024 Distance walked: [employer] device utilized: Single point cane Level of assistance: Complete Independence Comments: -             TREATMENT        DATE: 05/26/24  Recumbent bike lvl 5 seat 9  for ROM, endurance. Sidelying hip abduction SLR 2 x 10 - performed bilaterally   Supine bridge with blue band hip abduction hold 2-3 sec hold 2 x 10  Supine hooklying trunk rotation 15 sec x 3 bilateral Standing lumbar extension AROM  x 10 Leg Press #112 2x15 bilateral; #50 2x10 each leg    TREATMENT        DATE: 05/24/2024 Therex: Recumbent bike lvl 5 seat 9 - 10 mins for ROM, endurance. Sidelying hip abduction SLR 2 x 10 - performed bilaterally   Supine bridge with blue band hip abduction hold 2-3 sec hold 2 x 10  Supine  hooklying trunk rotation 15 sec x 3 bilateral Standing lumbar extension AROM x 5   Manual Percussive device to trigger points in Lt glute med/min     TREATMENT        DATE: 05/20/24 Therex: Recumbent bike lvl 5 seat 9 - 10 mins for ROM, endurance.  Leg press double leg 112 lbs 2 x 15 with slow lowering focus.  Leg press single leg 50 lbs x 15 with slow lowering focus.  Standing lumbar extension AROM 2 x 10 Prone press up 5 sec hold on table with pillows under abdomen x 10    Neuro Re-ed (muscle coordination, postural support) Prone opposite arm/leg lift 3 sec hold x 10 bilateral, pillows under abdomen Standing GH ext bilateral on wall /hitchhikers 2x10 Sit to stand 15# 2x10     TREATMENT        DATE:05/13/2024 Therex: Recumbent bike lvl 5 seat 9 - 10 mins for ROM, endurance.  Leg press double leg 100 lbs x 20 with slow lowering focus.  Leg press single leg 50 lbs x 15 with slow lowering focus.  Standing lumbar extension AROM 2 x 5  Prone press up 2 sec hold on table with pillows under abdomen x 10    Neuro Re-ed (muscle coordination, postural support) Prone opposite arm/leg lift 3 sec hold x 10 bilateral, pillows under abdomen Standing GH ext bilateral with 5 sec pause 10 blue band  Anti rotation side stepping double blue band 5 sec hold x 10 bilaterally    TREATMENT        DATE:05/09/24 Rec bike level 2 8 min Leg Press 75 # 3x10 Sit to stand 15# 2x10 Prone props 30 sec up x5 Prone hip ext 2x10 bilateral Bridges 2x10 Supine Marching 2x20   PATIENT EDUCATION:  05/03/2024 Education details: HEP Person educated: Patient Education method: Programmer, Multimedia, Demonstration, Actor cues, and Verbal cues Education comprehension: verbalized understanding, returned demonstration, verbal cues required, and tactile cues required  HOME EXERCISE PROGRAM: Access Code: [email or web address] URL: [email or web address] Date: 05/24/2024 Prepared by: Ozell Silvan  Exercises - Supine Bridge  - 2 x daily - 7 x weekly - 2 sets - 10 reps - Supine March  - 2 x daily - 7 x weekly - 2 sets - 10 reps - Supine Lower Trunk Rotation  - 2 x daily - 7 x weekly - 2 sets - 20 reps - Supine Single Knee to Chest Stretch  - 2 x daily - 7 x weekly - 1 sets - 3 reps - 25 hold - Prone Press Up On Elbows  - 2 x daily - 7 x weekly - 1 sets - 10 reps - 20 hold - Sidelying Hip Abduction  - 1 x daily - 7 x weekly - 3 sets - 10 reps - Standing Lumbar Extension  - 2-4 x daily - 7 x weekly - 1 sets - 5-10 reps ASSESSMENT:  CLINICAL IMPRESSION: Reduction with LE tingling with spinal extensions and stabilization exercises. OBJECTIVE IMPAIRMENTS: Abnormal gait, decreased ROM, decreased strength, increased muscle spasms, impaired flexibility, impaired sensation, and pain.   ACTIVITY LIMITATIONS: standing,  squatting, sleeping, and transfers  PARTICIPATION LIMITATIONS: community activity and occupation  PERSONAL FACTORS: Fitness are also affecting patient's functional outcome.   REHAB POTENTIAL: Good  CLINICAL DECISION MAKING: Stable/uncomplicated  EVALUATION COMPLEXITY: Moderate   GOALS: Goals reviewed with patient? Yes  SHORT TERM GOALS: Target date: 05/23/2024    Patient to be independent with HEP. Baseline: Goal status: met 05/20/24  2.  Decrease pain by 1 level. Baseline:  Goal status: met 05/20/24  LONG TERM GOALS: Target date: 06/27/2024 8 weeks    Patient to be independent with self progressive HEP at time of discharge. Baseline:  Goal status: on going 05/24/2024  2.  Increase active range of motion by 25%. Baseline:  Goal status: Met 05/24/2024  3.  Increase strength of lower extremities and spinal stabilizers by half grade to 1 full grade by discharge. Baseline:  Goal status: on going 05/24/2024  4.  Improve numbness in left lower extremity by 50%. Baseline:  Goal status: on going 05/24/2024  5.  Improve patient's score on PSFS by  greater than 3 points to show detectable change. Baseline:  Goal status: on going 05/24/2024  6.  Able to ambulate without AD community distances with no pain or symptoms. Baseline:  Goal status: on going 05/24/2024  PLAN:  PT FREQUENCY: 2x/week  PT DURATION: 8 weeks  PLANNED INTERVENTIONS: 97164- PT Re-evaluation, 97110-Therapeutic exercises, 97530- Therapeutic activity, 97112- Neuromuscular re-education, 97535- Self Care, 02859- Manual therapy, [phone]- Aquatic Therapy, Patient/Family education, Balance training, Spinal mobilization, Cryotherapy, and Moist heat.  PLAN FOR NEXT SESSION: Check on trigger point release treatment for more thera gun work.  Burnard Meth, PT 05/26/24  12:34 PM

## 2024-05-26 ENCOUNTER — Ambulatory Visit (INDEPENDENT_AMBULATORY_CARE_PROVIDER_SITE_OTHER)

## 2024-05-26 DIAGNOSIS — M5416 Radiculopathy, lumbar region: Secondary | ICD-10-CM | POA: Diagnosis not present

## 2024-05-26 DIAGNOSIS — R2689 Other abnormalities of gait and mobility: Secondary | ICD-10-CM

## 2024-05-26 DIAGNOSIS — M5459 Other low back pain: Secondary | ICD-10-CM | POA: Diagnosis not present

## 2024-05-29 NOTE — Therapy (Signed)
 OUTPATIENT PHYSICAL THERAPY TREATMENT   Patient Name: Bill Ferrell MRN: [id number] DOB:[birth year], 58 y.o., male Today's Date: 05/31/2024  END OF SESSION:  PT End of Session - 05/31/24 0923     Visit Number 9    Number of Visits 17    Date for Recertification  06/27/24    Authorization Type [employer]    PT Start Time [phone]    PT Stop Time 0922    PT Time Calculation (min) 38 min    Activity Tolerance Patient tolerated treatment well    Behavior During Therapy [facility] for tasks assessed/performed                  Past Medical History:  Diagnosis Date   Gout    Hypertension    History reviewed. No pertinent surgical history. Patient Active Problem List   Diagnosis Date Noted   Alcohol dependence with uncomplicated withdrawal (HCC) 10/08/2014   Substance induced mood disorder (HCC) 10/08/2014   Cocaine abuse (HCC) 10/08/2014    PCP: Rexanne Ingle, MD   REFERRING PROVIDER: Gretta Bertrum ORN, PA-C   REFERRING DIAG: R20.0 (ICD-10-CM) - Left leg numbness   Rationale for Evaluation and Treatment: Rehabilitation  THERAPY DIAG:  Radiculopathy, lumbar region  Other low back pain  Other abnormalities of gait and mobility  ONSET DATE: 6 months ago  SUBJECTIVE:                                                                                                                                                                                           SUBJECTIVE STATEMENT: Pt reports some start up pain and tingling in the left LE with walking . PERTINENT HISTORY:  Eval-Couple years ago the LLE and groin started hurting.  It has progressed.  States he also feels numbness that is worse with standing.  Does not feel it in the toes.  Started using a cane a week or 2 ago-to  keep the pressure off.  No spinal surgeries.    PAIN:  NPRS scale: up to 3/10 Pain location: Lt hip/thigh Pain description: numb Aggravating factors: variable.  Relieving factors:  stretching  PRECAUTIONS: None  RED FLAGS: Bowel or bladder incontinence: No   WEIGHT BEARING RESTRICTIONS: No  FALLS:  Has patient fallen in last 6 months? No  LIVING ENVIRONMENT: Lives with: lives with their family Lives in: House/apartment Stairs: Yes: Internal: 12 steps; has rails Has following equipment at home: Single point cane  OCCUPATION: meat cutter   PLOF: Independent  PATIENT GOALS: Decrease pain.  NEXT MD VISIT: unknown  OBJECTIVE:  Note: Objective measures were completed at Evaluation unless otherwise  noted.  DIAGNOSTIC FINDINGS:  04/28/24  Xray Lumbar spine 2 views: No spondylolisthesis no acute fractures.  This space  overall well-maintained except for L4-5 S1 which is slightly narrowed.   Minimal anterior vertebral spurring at multiple levels otherwise no  significant arthritic changes.  Loss of lordotic curvature.   PATIENT SURVEYS:  PSFS: THE PATIENT SPECIFIC FUNCTIONAL SCALE  Place score of 0-10 (0 = unable to perform activity and 10 = able to perform activity at the same level as before injury or problem)  Activity Date: 05/02/24    standing 5    2.walking 5    3.sleeping on L 3    4.      Total Score 4.33      Total Score = Sum of activity scores/number of activities  Minimally Detectable Change: 3 points (for single activity); 2 points (for average score)  [facility] (nd). The Patient Specific Functional Scale . Retrieved from [email or web address]   COGNITION: 05/02/2024 Overall cognitive status: Within functional limits for tasks assessed     SENSATION: 05/02/2024 NT   MUSCLE LENGTH: 05/02/2024 Hamstrings: Right mod to severe restriction   POSTURE:  05/02/2024 LE ER, flattened lordosis  PALPATION: 05/24/2024: Trigger points noted in Lt glute med/min with concordant hip symptoms noted.   05/02/2024 1+ tenderness R PVM  LUMBAR ROM:   AROM Eval 05/02/2024  05/13/2024  Flexion 50%   Extension 50% 50% on 1st rep  Repeated x 5 in standing, improved to 75% with reduced leg numbness after sitting.   Right lateral flexion 100%   Left lateral flexion 50%P   Right rotation 75%   Left rotation 75%    (Blank rows = not tested)  LOWER EXTREMITY ROM:     Active /Passive Right eval Left Eval 05/02/2024  Hip flexion    Hip extension    Hip abduction  25 P groin  Hip adduction    Hip internal rotation    Hip external rotation    Knee flexion    Knee extension    Ankle dorsiflexion    Ankle plantarflexion    Ankle inversion    Ankle eversion     (Blank rows = not tested)  LOWER EXTREMITY MMT:    MMT Right eval Left Eval 05/02/2024 Right 05/24/2024 Left 05/24/2024  Hip flexion  4+    Hip extension      Hip abduction   5/5 4+/5  Hip adduction      Hip internal rotation      Hip external rotation      Knee flexion      Knee extension  4+    Ankle dorsiflexion  4+    Ankle plantarflexion      Ankle inversion      Ankle eversion       (Blank rows = not tested)  LUMBAR SPECIAL TESTS:  05/02/2024 Straight leg raise test: Negative  FUNCTIONAL TESTS:  05/02/2024 5 times sit to stand: 23 sec  GAIT: 05/17/2024: Independent ambulation into clinic.   05/02/2024 Distance walked: [employer] device utilized: Single point cane Level of assistance: Complete Independence Comments: -             TREATMENT        DATE: 05/31/24  Recumbent bike lvl 5 seat 9 min  for ROM, endurance. Supine hip abduction Black T band 3x10 Supine bridge with black band 2-3 sec hold 2 x 10  Supine hooklying trunk rotation 15 sec x 3  bilateral Standing lumbar extension AROM x 10 Seated LAQ 4# 3x10  Leg Press #125 3x10 bilateral; #50 2x15 each leg Prone press ups 20 sec x 10    TREATMENT        DATE: 05/26/24  Recumbent bike lvl 5 seat 9  for ROM, endurance. Sidelying hip abduction SLR 2 x 10 - performed bilaterally   Supine  bridge with blue band hip abduction hold 2-3 sec hold 2 x 10  Supine hooklying trunk rotation 15 sec x 3 bilateral Standing lumbar extension AROM x 10 Leg Press #112 2x15 bilateral; #50 2x10 each leg    TREATMENT        DATE: 05/24/2024 Therex: Recumbent bike lvl 5 seat 9 - 10 mins for ROM, endurance. Sidelying hip abduction SLR 2 x 10 - performed bilaterally   Supine bridge with blue band hip abduction hold 2-3 sec hold 2 x 10  Supine hooklying trunk rotation 15 sec x 3 bilateral Standing lumbar extension AROM x 5   Manual Percussive device to trigger points in Lt glute med/min     TREATMENT        DATE: 05/20/24 Therex: Recumbent bike lvl 5 seat 9 - 10 mins for ROM, endurance.  Leg press double leg 112 lbs 2 x 15 with slow lowering focus.  Leg press single leg 50 lbs x 15 with slow lowering focus.  Standing lumbar extension AROM 2 x 10 Prone press up 5 sec hold on table with pillows under abdomen x 10    Neuro Re-ed (muscle coordination, postural support) Prone opposite arm/leg lift 3 sec hold x 10 bilateral, pillows under abdomen Standing GH ext bilateral on wall /hitchhikers 2x10 Sit to stand 15# 2x10     TREATMENT        DATE:05/13/2024 Therex: Recumbent bike lvl 5 seat 9 - 10 mins for ROM, endurance.  Leg press double leg 100 lbs x 20 with slow lowering focus.  Leg press single leg 50 lbs x 15 with slow lowering focus.  Standing lumbar extension AROM 2 x 5  Prone press up 2 sec hold on table with pillows under abdomen x 10    Neuro Re-ed (muscle coordination, postural support) Prone opposite arm/leg lift 3 sec hold x 10 bilateral, pillows under abdomen Standing GH ext bilateral with 5 sec pause 10 blue band  Anti rotation side stepping double blue band 5 sec hold x 10 bilaterally    TREATMENT        DATE:05/09/24 Rec bike level 2 8 min Leg Press 75 # 3x10 Sit to stand 15# 2x10 Prone props 30 sec up x5 Prone hip ext 2x10 bilateral Bridges 2x10 Supine  Marching 2x20   PATIENT EDUCATION:  05/03/2024 Education details: HEP Person educated: Patient Education method: Programmer, Multimedia, Demonstration, Actor cues, and Verbal cues Education comprehension: verbalized understanding, returned demonstration, verbal cues required, and tactile cues required  HOME EXERCISE PROGRAM: Access Code: [email or web address] URL: [email or web address] Date: 05/24/2024 Prepared by: Ozell Silvan  Exercises - Supine Bridge  - 2 x daily - 7 x weekly - 2 sets - 10 reps - Supine March  - 2 x daily - 7 x weekly - 2 sets - 10 reps - Supine Lower Trunk Rotation  - 2 x daily - 7 x weekly - 2 sets - 20 reps - Supine Single Knee to Chest Stretch  - 2 x daily - 7 x weekly - 1 sets - 3 reps - 25 hold - Prone Press Up On Elbows  -  2 x daily - 7 x weekly - 1 sets - 10 reps - 20 hold - Sidelying Hip Abduction  - 1 x daily - 7 x weekly - 3 sets - 10 reps - Standing Lumbar Extension  - 2-4 x daily - 7 x weekly - 1 sets - 5-10 reps ASSESSMENT:  CLINICAL IMPRESSION: Pt needed VC for prone props.  Demonstrated understanding.  OBJECTIVE IMPAIRMENTS: Abnormal gait, decreased ROM, decreased strength, increased muscle spasms, impaired flexibility, impaired sensation, and pain.   ACTIVITY LIMITATIONS: standing, squatting, sleeping, and transfers  PARTICIPATION LIMITATIONS: community activity and occupation  PERSONAL FACTORS: Fitness are also affecting patient's functional outcome.   REHAB POTENTIAL: Good  CLINICAL DECISION MAKING: Stable/uncomplicated  EVALUATION COMPLEXITY: Moderate   GOALS: Goals reviewed with patient? Yes  SHORT TERM GOALS: Target date: 05/23/2024    Patient to be independent with HEP. Baseline: Goal status: met 05/20/24  2.  Decrease pain by 1 level. Baseline:  Goal status: met 05/20/24  LONG TERM GOALS: Target date: 06/27/2024 8 weeks    Patient to be independent with self progressive HEP at time of discharge. Baseline:  Goal  status: on going 05/24/2024  2.  Increase active range of motion by 25%. Baseline:  Goal status: Met 05/24/2024  3.  Increase strength of lower extremities and spinal stabilizers by half grade to 1 full grade by discharge. Baseline:  Goal status: on going 05/24/2024  4.  Improve numbness in left lower extremity by 50%. Baseline:  Goal status: on going 05/24/2024  5.  Improve patient's score on PSFS by greater than 3 points to show detectable change. Baseline:  Goal status: on going 05/24/2024  6.  Able to ambulate without AD community distances with no pain or symptoms. Baseline:  Goal status: on going 05/24/2024  PLAN:  PT FREQUENCY: 2x/week  PT DURATION: 8 weeks  PLANNED INTERVENTIONS: 97164- PT Re-evaluation, 97110-Therapeutic exercises, 97530- Therapeutic activity, 97112- Neuromuscular re-education, 97535- Self Care, 02859- Manual therapy, [phone]- Aquatic Therapy, Patient/Family education, Balance training, Spinal mobilization, Cryotherapy, and Moist heat.  PLAN FOR NEXT SESSION: Continue with stabilization program.  Burnard Meth, PT 05/31/24  9:24 AM

## 2024-05-30 ENCOUNTER — Encounter: Payer: Self-pay | Admitting: Radiology

## 2024-05-31 ENCOUNTER — Ambulatory Visit

## 2024-05-31 DIAGNOSIS — R2689 Other abnormalities of gait and mobility: Secondary | ICD-10-CM

## 2024-05-31 DIAGNOSIS — M5459 Other low back pain: Secondary | ICD-10-CM

## 2024-05-31 DIAGNOSIS — M5416 Radiculopathy, lumbar region: Secondary | ICD-10-CM | POA: Diagnosis not present

## 2024-06-01 ENCOUNTER — Encounter: Payer: Self-pay | Admitting: Radiology

## 2024-06-01 ENCOUNTER — Ambulatory Visit: Admitting: Physician Assistant

## 2024-06-01 ENCOUNTER — Encounter: Payer: Self-pay | Admitting: Physician Assistant

## 2024-06-01 DIAGNOSIS — R200 Anesthesia of skin: Secondary | ICD-10-CM | POA: Diagnosis not present

## 2024-06-01 NOTE — Progress Notes (Signed)
 HPI: Bill Ferrell returns today for follow-up of his low back pain with radicular symptoms down his left leg.  He has found physical therapy to be beneficial he is going for some 5 weeks of therapy at this point in time for his back.  However he continues to have numbness tingling down the left leg to the ankle.  He denies any waking pain saddle anesthesia or bowel or bladder dysfunction fevers chills.  He is no longer using a cane to ambulate.  He continues to take Tylenol  and ibuprofen  back pain.  He has been out of work since we saw him some 5 weeks ago.  Review of systems see HPI  Physical exam: General Well-developed well-nourished male no acute distress ambulates without any assistive device nonantalgic gait. Vascular: Dorsal pedal pulses 2+ and equal symmetric calf supple nontender bilaterally Lower extremities: Bilateral 5 strength throughout lower extremities against resistance.  Exquisitely tight hamstrings bilaterally. Lumbar spine: He lacks last 4 inches of being all touches toes.  Negative straight leg raise on the right ,positive on the left.   Impression: Left-sided lumbar radiculopathy  Plan: Due to the fact the patient failed conservative treatment which is included medications, NSAIDs prednisone  muscle relaxants along with hip physical therapy.  Recommend MRI of the lumbar spine to evaluate for HNP as a source of his left lumbar radiculopathy.  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  Will keep him at light duty until follow-up after the MRI.  Questions were encouraged and answered.

## 2024-06-01 NOTE — Addendum Note (Signed)
 Addended by: PETER FRIEZE B on: 06/01/2024 01:42 PM   Modules accepted: Orders

## 2024-06-01 NOTE — Addendum Note (Signed)
 Addended by: GRETTA FORTE on: 06/01/2024 10:38 AM   Modules accepted: Orders

## 2024-06-02 ENCOUNTER — Encounter: Payer: Self-pay | Admitting: Rehabilitative and Restorative Service Providers"

## 2024-06-02 ENCOUNTER — Ambulatory Visit (INDEPENDENT_AMBULATORY_CARE_PROVIDER_SITE_OTHER): Admitting: Rehabilitative and Restorative Service Providers"

## 2024-06-02 DIAGNOSIS — M5416 Radiculopathy, lumbar region: Secondary | ICD-10-CM

## 2024-06-02 DIAGNOSIS — R2689 Other abnormalities of gait and mobility: Secondary | ICD-10-CM

## 2024-06-02 DIAGNOSIS — M5459 Other low back pain: Secondary | ICD-10-CM

## 2024-06-02 NOTE — Therapy (Addendum)
 " OUTPATIENT PHYSICAL THERAPY TREATMENT / DISCHARGE   Patient Name: Bill Ferrell MRN: [id number] DOB:1965-11-13, 58 y.o., male Today's Date: 06/02/2024  END OF SESSION:  PT End of Session - 06/02/24 0809     Visit Number 10    Number of Visits 17    Date for Recertification  06/27/24    Authorization Type [employer]    PT Start Time 0759    PT Stop Time 0838    PT Time Calculation (min) 39 min    Activity Tolerance Patient tolerated treatment well    Behavior During Therapy [facility] for tasks assessed/performed                   Past Medical History:  Diagnosis Date   Gout    Hypertension    History reviewed. No pertinent surgical history. Patient Active Problem List   Diagnosis Date Noted   Alcohol dependence with uncomplicated withdrawal (HCC) 10/08/2014   Substance induced mood disorder (HCC) 10/08/2014   Cocaine abuse (HCC) 10/08/2014    PCP: Rexanne Ingle, MD   REFERRING PROVIDER: Gretta Bertrum ORN, PA-C   REFERRING DIAG: R20.0 (ICD-10-CM) - Left leg numbness   Rationale for Evaluation and Treatment: Rehabilitation  THERAPY DIAG:  Radiculopathy, lumbar region  Other low back pain  Other abnormalities of gait and mobility  ONSET DATE: 6 months ago  SUBJECTIVE:                                                                                                                                                                                           SUBJECTIVE STATEMENT: Pt indicated feeling about 40% back to normal.  Pt indicated thinking the hip work from a few visits ago was help.  Reported symptoms just in the hip with pain/numbness.   Symptoms occur about 3-4 days per day.   Last for about a minute.    PERTINENT HISTORY:  Eval-Couple years ago the LLE and groin started hurting.  It has progressed.  States he also feels numbness that is worse with standing.  Does not feel it in the toes.  Started using a cane a week or 2 ago-to  keep the pressure  off.  No spinal surgeries.    PAIN:  NPRS scale: up to 8/10.   Pain location: Lt hip/thigh Pain description: numb, intermittent.  Aggravating factors: variable.  Relieving factors: stretching  PRECAUTIONS: None  RED FLAGS: Bowel or bladder incontinence: No   WEIGHT BEARING RESTRICTIONS: No  FALLS:  Has patient fallen in last 6 months? No  LIVING ENVIRONMENT: Lives with: lives with their family Lives in: House/apartment Stairs: Yes:  Internal: 12 steps; has rails Has following equipment at home: Single point cane  OCCUPATION: meat cutter   PLOF: Independent  PATIENT GOALS: Decrease pain.  NEXT MD VISIT: unknown  OBJECTIVE:  Note: Objective measures were completed at Evaluation unless otherwise noted.  DIAGNOSTIC FINDINGS:  04/28/24  Xray Lumbar spine 2 views: No spondylolisthesis no acute fractures.  This space  overall well-maintained except for L4-5 S1 which is slightly narrowed.   Minimal anterior vertebral spurring at multiple levels otherwise no  significant arthritic changes.  Loss of lordotic curvature.   PATIENT SURVEYS:  PSFS: THE PATIENT SPECIFIC FUNCTIONAL SCALE  Place score of 0-10 (0 = unable to perform activity and 10 = able to perform activity at the same level as before injury or problem)  Activity Date: 05/02/24 06/02/2024   standing 5 10   2.walking 5 10   3.sleeping on L 3 6   4.      Total Score 4.33 8.667     Total Score = Sum of activity scores/number of activities  Minimally Detectable Change: 3 points (for single activity); 2 points (for average score)  [facility] (nd). The Patient Specific Functional Scale . Retrieved from [email or web address]   COGNITION: 05/02/2024 Overall cognitive status: Within functional limits for tasks assessed     SENSATION: 05/02/2024 NT   MUSCLE LENGTH: 05/02/2024 Hamstrings: Right mod to severe restriction   POSTURE:   05/02/2024 LE ER, flattened lordosis  PALPATION: 05/24/2024: Trigger points noted in Lt glute med/min with concordant hip symptoms noted.   05/02/2024 1+ tenderness R PVM  LUMBAR ROM:   AROM Eval 05/02/2024 05/13/2024 06/02/2024  Flexion 50%    Extension 50% 50% on 1st rep  Repeated x 5 in standing, improved to 75% with reduced leg numbness after sitting.  75% WFL  Better symptoms with 5 reps.   Right lateral flexion 100%    Left lateral flexion 50%P    Right rotation 75%    Left rotation 75%     (Blank rows = not tested)  LOWER EXTREMITY ROM:     Active /Passive Right eval Left Eval 05/02/2024  Hip flexion    Hip extension    Hip abduction  25 P groin  Hip adduction    Hip internal rotation    Hip external rotation    Knee flexion    Knee extension    Ankle dorsiflexion    Ankle plantarflexion    Ankle inversion    Ankle eversion     (Blank rows = not tested)  LOWER EXTREMITY MMT:    MMT Right eval Left Eval 05/02/2024 Right 05/24/2024 Left 05/24/2024  Hip flexion  4+    Hip extension      Hip abduction   5/5 4+/5  Hip adduction      Hip internal rotation      Hip external rotation      Knee flexion      Knee extension  4+    Ankle dorsiflexion  4+    Ankle plantarflexion      Ankle inversion      Ankle eversion       (Blank rows = not tested)  LUMBAR SPECIAL TESTS:  05/02/2024 Straight leg raise test: Negative  FUNCTIONAL TESTS:  05/02/2024 5 times sit to stand: 23 sec  GAIT: 05/17/2024: Independent ambulation into clinic.   05/02/2024 Distance walked: [employer] device utilized: Single point cane Level of assistance: Complete Independence Comments: -  TREATMENT        DATE: 06/02/2024 Therex: Recumbent bike seat 9 , lvl 4 10 mins Standing doorway hip hike 3 sec hold x 10 bilaterally  Lateral shift correction wall at Left, hip movement to Lt 2 sec hold x 10 for trial (symptoms no worse or better)   Standing lumbar extension AROM x 5   Manual Percussive device to trigger points in Lt glute med/min, Lateral thigh  TREATMENT        DATE: 05/31/24  Recumbent bike lvl 5 seat 9 min  for ROM, endurance. Supine hip abduction Black T band 3x10 Supine bridge with black band 2-3 sec hold 2 x 10  Supine hooklying trunk rotation 15 sec x 3 bilateral Standing lumbar extension AROM x 10 Seated LAQ 4# 3x10  Leg Press #125 3x10 bilateral; #50 2x15 each leg Prone press ups 20 sec x 10    TREATMENT        DATE: 05/26/24  Recumbent bike lvl 5 seat 9  for ROM, endurance. Sidelying hip abduction SLR 2 x 10 - performed bilaterally   Supine bridge with blue band hip abduction hold 2-3 sec hold 2 x 10  Supine hooklying trunk rotation 15 sec x 3 bilateral Standing lumbar extension AROM x 10 Leg Press #112 2x15 bilateral; #50 2x10 each leg    TREATMENT        DATE: 05/24/2024 Therex: Recumbent bike lvl 5 seat 9 - 10 mins for ROM, endurance. Sidelying hip abduction SLR 2 x 10 - performed bilaterally   Supine bridge with blue band hip abduction hold 2-3 sec hold 2 x 10  Supine hooklying trunk rotation 15 sec x 3 bilateral Standing lumbar extension AROM x 5   Manual Percussive device to trigger points in Lt glute med/min    PATIENT EDUCATION:  05/03/2024 Education details: HEP Person educated: Patient Education method: Programmer, Multimedia, Demonstration, Actor cues, and Verbal cues Education comprehension: verbalized understanding, returned demonstration, verbal cues required, and tactile cues required  HOME EXERCISE PROGRAM: Access Code: [email or web address] URL: [email or web address] Date: 05/24/2024 Prepared by: Ozell Silvan  Exercises - Supine Bridge  - 2 x daily - 7 x weekly - 2 sets - 10 reps - Supine March  - 2 x daily - 7 x weekly - 2 sets - 10 reps - Supine Lower Trunk Rotation  - 2 x daily - 7 x weekly - 2 sets - 20 reps - Supine Single Knee to Chest Stretch  - 2 x daily  - 7 x weekly - 1 sets - 3 reps - 25 hold - Prone Press Up On Elbows  - 2 x daily - 7 x weekly - 1 sets - 10 reps - 20 hold - Sidelying Hip Abduction  - 1 x daily - 7 x weekly - 3 sets - 10 reps - Standing Lumbar Extension  - 2-4 x daily - 7 x weekly - 1 sets - 5-10 reps ASSESSMENT:  CLINICAL IMPRESSION: Pt indicated about 40% improvement to this point.  Patient specific functional scale rating was improved compared to eval.  Symptoms intermittent insidious onset 3-4 days per day in Lt hip/leg still.  Plan to get MRI per Pt report.  May continue to benefit from skilled PT services to address remaining impairments/limitations.  Benefits from percussive device noted in clinic today.   OBJECTIVE IMPAIRMENTS: Abnormal gait, decreased ROM, decreased strength, increased muscle spasms, impaired flexibility, impaired sensation, and pain.   ACTIVITY LIMITATIONS: standing, squatting, sleeping, and transfers  PARTICIPATION  LIMITATIONS: community activity and occupation  PERSONAL FACTORS: Fitness are also affecting patient's functional outcome.   REHAB POTENTIAL: Good  CLINICAL DECISION MAKING: Stable/uncomplicated  EVALUATION COMPLEXITY: Moderate   GOALS: Goals reviewed with patient? Yes  SHORT TERM GOALS: Target date: 05/23/2024    Patient to be independent with HEP. Baseline: Goal status: met 05/20/24  2.  Decrease pain by 1 level. Baseline:  Goal status: met 05/20/24  LONG TERM GOALS: Target date: 06/27/2024 8 weeks    Patient to be independent with self progressive HEP at time of discharge. Baseline:  Goal status: on going 05/24/2024  2.  Increase active range of motion by 25%. Baseline:  Goal status: Met 05/24/2024  3.  Increase strength of lower extremities and spinal stabilizers by half grade to 1 full grade by discharge. Baseline:  Goal status: on going 05/24/2024  4.  Improve numbness in left lower extremity by 50%. Baseline:  Goal status: on going 05/24/2024  5.   Improve patient's score on PSFS by greater than 3 points to show detectable change. Baseline:  Goal status: on going 05/24/2024  6.  Able to ambulate without AD community distances with no pain or symptoms. Baseline:  Goal status: on going 05/24/2024  PLAN:  PT FREQUENCY: 2x/week  PT DURATION: 8 weeks  PLANNED INTERVENTIONS: 97164- PT Re-evaluation, 97110-Therapeutic exercises, 97530- Therapeutic activity, 97112- Neuromuscular re-education, 97535- Self Care, 02859- Manual therapy, [id number]- Aquatic Therapy, Patient/Family education, Balance training, Spinal mobilization, Cryotherapy, and Moist heat.  PLAN FOR NEXT SESSION: Percussive device as desired.    Ozell Silvan, PT, DPT, OCS, ATC 06/02/24  8:41 AM    PHYSICAL THERAPY DISCHARGE SUMMARY  Visits from Start of Care: 10  Current functional level related to goals / functional outcomes: See note   Remaining deficits: See note   Education / Equipment: HEP  Patient goals were partially met. Patient is being discharged due to not returning since the last visit.  Ozell Silvan, PT, DPT, OCS, ATC 08/24/24  3:16 PM     "

## 2024-06-10 ENCOUNTER — Encounter: Payer: Self-pay | Admitting: Radiology

## 2024-06-10 ENCOUNTER — Telehealth: Payer: Self-pay | Admitting: Physician Assistant

## 2024-06-10 NOTE — Telephone Encounter (Signed)
 Pt came in office and states his left hip is worse and asking to be put out of work again. Pt need note for employer. Please call pt when ready for pick up. Pt phone number is [phone].

## 2024-06-10 NOTE — Telephone Encounter (Signed)
 Called patient. Note is at front desk

## 2024-06-18 ENCOUNTER — Ambulatory Visit
Admission: RE | Admit: 2024-06-18 | Discharge: 2024-06-18 | Disposition: A | Source: Ambulatory Visit | Attending: Physician Assistant | Admitting: Physician Assistant

## 2024-06-18 DIAGNOSIS — R200 Anesthesia of skin: Secondary | ICD-10-CM

## 2024-06-28 ENCOUNTER — Encounter: Payer: Self-pay | Admitting: Radiology

## 2024-06-29 ENCOUNTER — Other Ambulatory Visit: Payer: Self-pay | Admitting: Radiology

## 2024-06-29 ENCOUNTER — Ambulatory Visit: Admitting: Physician Assistant

## 2024-06-29 ENCOUNTER — Encounter: Payer: Self-pay | Admitting: Physician Assistant

## 2024-06-29 DIAGNOSIS — M5416 Radiculopathy, lumbar region: Secondary | ICD-10-CM

## 2024-06-29 NOTE — Progress Notes (Signed)
 HPI: Bill Ferrell comes in today for follow-up status post MRI of the lumbar spine without contrast dated 06/22/2024.  He states that his pain may have gotten a little bit better since he was last seen but still having radicular pain down the left leg only to the knee.  Otherwise no change.  He has been doing no exercises although he has been through physical therapy in the past for his lumbar spine.  He remains out of work as he did try to go back light duty and was unable to stand he was working as a midwife at [employer].  Review of systems see HPI  MRI images of the lumbar spine dated 06/22/2024 are reviewed with the patient.  It shows mild to moderate stenosis on the right at L2-3.  L4 3 4 lateral recess stenosis right greater than left circumferential disc bulge to the right with extraforaminal herniation.  Could result in right sided neural compression.  L4-5 moderate to severe multifactorial stenosis.  Foraminal narrowing is worse on the right than the left.  L5-S1 bilateral foraminal narrowing left greater than right with 2 mm anterior listhesis.  No compression of the central canal.   Impression: Lumbar radiculopathy left-sided  Plan: We will keep him out of work until he can undergo epidural steroid injection for the lumbar radiculopathy.  Will see him back 2 weeks after the epidural steroid injection to reevaluate his work status.  Did stressed with him that in the future back exercises imperative to managing his radiculopathy.  Questions were encouraged and answered

## 2024-07-05 NOTE — Discharge Instructions (Signed)

## 2024-07-06 ENCOUNTER — Inpatient Hospital Stay
Admission: RE | Admit: 2024-07-06 | Discharge: 2024-07-06 | Attending: Physician Assistant | Admitting: Physician Assistant

## 2024-07-06 DIAGNOSIS — M5416 Radiculopathy, lumbar region: Secondary | ICD-10-CM

## 2024-07-06 MED ORDER — METHYLPREDNISOLONE ACETATE 40 MG/ML INJ SUSP (RADIOLOG
80.0000 mg | Freq: Once | INTRAMUSCULAR | Status: AC
  Administered 2024-07-06: 80 mg via EPIDURAL

## 2024-07-06 MED ORDER — IOPAMIDOL (ISOVUE-M 200) INJECTION 41%
1.0000 mL | Freq: Once | INTRAMUSCULAR | Status: AC
  Administered 2024-07-06: 1 mL via EPIDURAL

## 2024-07-13 ENCOUNTER — Encounter: Payer: Self-pay | Admitting: Radiology

## 2024-07-16 ENCOUNTER — Encounter (HOSPITAL_BASED_OUTPATIENT_CLINIC_OR_DEPARTMENT_OTHER): Payer: Self-pay

## 2024-07-16 ENCOUNTER — Emergency Department (HOSPITAL_BASED_OUTPATIENT_CLINIC_OR_DEPARTMENT_OTHER)
Admission: EM | Admit: 2024-07-16 | Discharge: 2024-07-16 | Disposition: A | Discharge status: Home / Self Care | Attending: Emergency Medicine | Admitting: Emergency Medicine

## 2024-07-16 ENCOUNTER — Other Ambulatory Visit: Payer: Self-pay

## 2024-07-16 DIAGNOSIS — J111 Influenza due to unidentified influenza virus with other respiratory manifestations: Secondary | ICD-10-CM

## 2024-07-16 DIAGNOSIS — Z87891 Personal history of nicotine dependence: Secondary | ICD-10-CM | POA: Diagnosis not present

## 2024-07-16 DIAGNOSIS — R059 Cough, unspecified: Secondary | ICD-10-CM | POA: Diagnosis present

## 2024-07-16 DIAGNOSIS — Z79899 Other long term (current) drug therapy: Secondary | ICD-10-CM | POA: Diagnosis not present

## 2024-07-16 DIAGNOSIS — I10 Essential (primary) hypertension: Secondary | ICD-10-CM | POA: Insufficient documentation

## 2024-07-16 DIAGNOSIS — J101 Influenza due to other identified influenza virus with other respiratory manifestations: Secondary | ICD-10-CM | POA: Diagnosis not present

## 2024-07-16 LAB — RESP PANEL BY RT-PCR (RSV, FLU A&B, COVID)  RVPGX2
Influenza A by PCR: POSITIVE — AB
Influenza B by PCR: NEGATIVE
Resp Syncytial Virus by PCR: NEGATIVE
SARS Coronavirus 2 by RT PCR: NEGATIVE

## 2024-07-16 MED ORDER — DEXAMETHASONE 4 MG PO TABS
10.0000 mg | ORAL_TABLET | Freq: Once | ORAL | Status: AC
  Administered 2024-07-16: 10 mg via ORAL
  Filled 2024-07-16: qty 3

## 2024-07-16 NOTE — ED Provider Notes (Signed)
 " Bill Ferrell EMERGENCY DEPARTMENT AT [facility] Provider Note   CSN: [id number] Arrival date & time: 07/16/24  9041     Patient presents with: URI   Bill Ferrell is a 58 y.o. male.   Patient here with flulike symptoms for the last 3 to 4 days.  Wife with the similar symptoms on the last day or 2.  Former smoker.  Denies any chest pain shortness of breath but has had a bad cough.  Seems like fevers improved.  Denies any nausea vomiting diarrhea.  History of hypertension.  The history is provided by the patient.       Prior to Admission medications  Medication Sig Start Date End Date Taking? Authorizing Provider  amLODipine  (NORVASC ) 10 MG tablet Take 1 tablet by mouth once daily 10/16/21     cyclobenzaprine  (FLEXERIL ) 10 MG tablet Take 1 tablet (10 mg total) by mouth 2 (two) times daily as needed for muscle spasms. 03/22/23   Kingsley, Victoria K, DO  lidocaine  (LIDODERM ) 5 % Place 1 patch onto the skin daily. Remove & Discard patch within 12 hours or as directed by MD 03/22/23   Kingsley, Victoria K, DO  meloxicam  (MOBIC ) 7.5 MG tablet Take 1 tablet (7.5 mg total) by mouth daily. 05/19/22     metoprolol  tartrate (LOPRESSOR ) 50 MG tablet Take 1 tablet (50 mg total) by mouth 2 (two) times daily. 11/14/21     QUEtiapine  (SEROQUEL ) 100 MG tablet Take 1 tablet by mouth  at bedtime 09/10/21     QUEtiapine  (SEROQUEL ) 100 MG tablet Take 1 tablet (100 mg total) by mouth at bedtime. 11/24/22     traZODone  (DESYREL ) 50 MG tablet Take 1 tablet (50 mg total) by mouth at bedtime. 05/05/22       Allergies: Patient has no known allergies.    Review of Systems  Updated Vital Signs BP (!) 147/95 (BP Location: Right Arm)   Pulse 82   Temp 97.6 F (36.4 C)   Resp 15   SpO2 95%   Physical Exam Vitals and nursing note reviewed.  Constitutional:      General: He is not in acute distress.    Appearance: He is well-developed. He is not ill-appearing.  HENT:     Head: Normocephalic and  atraumatic.     Nose: Nose normal.     Mouth/Throat:     Mouth: Mucous membranes are moist.  Eyes:     Extraocular Movements: Extraocular movements intact.     Conjunctiva/sclera: Conjunctivae normal.     Pupils: Pupils are equal, round, and reactive to light.  Cardiovascular:     Rate and Rhythm: Normal rate and regular rhythm.     Pulses: Normal pulses.     Heart sounds: Normal heart sounds. No murmur heard. Pulmonary:     Effort: Pulmonary effort is normal. No respiratory distress.     Breath sounds: Normal breath sounds.  Abdominal:     General: Abdomen is flat.     Palpations: Abdomen is soft.     Tenderness: There is no abdominal tenderness.  Musculoskeletal:        General: No swelling.     Cervical back: Normal range of motion and neck supple.  Skin:    General: Skin is warm and dry.     Capillary Refill: Capillary refill takes less than 2 seconds.  Neurological:     General: No focal deficit present.     Mental Status: He is alert.  Psychiatric:  Mood and Affect: Mood normal.     (all labs ordered are listed, but only abnormal results are displayed) Labs Reviewed  RESP PANEL BY RT-PCR (RSV, FLU A&B, COVID)  [id number]    EKG: None  Radiology: No results found.   Procedures   Medications Ordered in the ED  dexamethasone  (DECADRON ) tablet 10 mg (has no administration in time range)                                    Medical Decision Making Risk Prescription drug management.   Bill Ferrell is here with flulike symptoms.  Family member with the same symptoms.  He is very well-appearing.  Clear breath sounds.  Symptoms for about 3 to 4 days.  We talked about Tamiflu but he does outside the window to likely have any good benefit from that.  He is a former smoker.  Will give him some Decadron  to help with cough.  Recommend continued use of Tylenol  and ibuprofen  and rest at home and hydration.  I have very low suspicion for pneumonia or any other acute  process given that he has no fever now.  Is not having any major sputum production.  Sick contact with the same symptoms in the household and I suspect influenza A.  Understand return precautions.  Discharged in good condition.  This chart was dictated using voice recognition software.  Despite best efforts to proofread,  errors can occur which can change the documentation meaning.      Final diagnoses:  Influenza-like illness    ED Discharge Orders     None          Ruthe Cornet, DO 07/16/24 1014  "

## 2024-07-16 NOTE — ED Triage Notes (Signed)
 He c/o uri sx x 4 days.

## 2024-07-16 NOTE — Discharge Instructions (Signed)
 Continue Tylenol  and ibuprofen .  Tylenol  1000 mg every 6 hours as needed.  Ibuprofen  400 mg every 8 hours as needed.  If fevers lasting more than 5 or 6 days please return for evaluation follow-up with your primary care doctor.

## 2024-07-18 ENCOUNTER — Ambulatory Visit: Admitting: Physician Assistant

## 2024-07-26 ENCOUNTER — Ambulatory Visit: Admitting: Physician Assistant

## 2024-07-26 ENCOUNTER — Encounter: Payer: Self-pay | Admitting: Physician Assistant

## 2024-07-26 ENCOUNTER — Telehealth: Payer: Self-pay

## 2024-07-26 DIAGNOSIS — M5416 Radiculopathy, lumbar region: Secondary | ICD-10-CM | POA: Diagnosis not present

## 2024-07-26 NOTE — Telephone Encounter (Signed)
 Tory wanted me to send you a message to see if you could squish this patient in your schedule anywhere? Your first available is Feb. Tory states he is in a lot of pain with pain and numbness on left side

## 2024-07-26 NOTE — Progress Notes (Signed)
 HPI: Bill Ferrell returns today follow-up for his lumbar spine with radicular symptoms left leg status post epidural steroid injection 07/06/2024 at [facility] imaging.  This was a left L4-5 interlaminar injection.  He states that he only got minimal relief about 50% of the sciatic symptoms he was having down the left leg.  Symptoms were down to the knee and now are only down to the hip.  Pain is worse at night when trying to sleep.  He ranks his pain to be 8 out of 10.  He remains out of work.  Review of systems: See HPI  Physical exam: General no acute distress mood and affect appropriate.  Ambulates without any assistive device. Bilateral hips: Good range of motion without pain.  Nontender over the trochanteric region of both hips. Lumbar spine: He comes within 6 inches of being able to touch his toes.  He has pain and limited extension of the lumbar spine.  Tenderness to the left lower paraspinous region.  Impression: Lumbar radiculopathy  Plan: At this point in time we will keep him out of work.  Will refer him to Dr. Georgina for further evaluation and treatment.  Questions were encouraged and answered at length.

## 2024-08-17 ENCOUNTER — Ambulatory Visit: Admitting: Orthopedic Surgery

## 2024-08-17 ENCOUNTER — Other Ambulatory Visit: Payer: Self-pay

## 2024-08-17 VITALS — BP 120/87 | HR 72 | Ht 75.0 in | Wt 272.0 lb

## 2024-08-17 DIAGNOSIS — M545 Low back pain, unspecified: Secondary | ICD-10-CM

## 2024-08-17 NOTE — Progress Notes (Signed)
 Orthopedic Spine Surgery Office Note  Assessment: Patient is a 59 y.o. male with low back, bilateral buttock pain. Left lateral thigh and leg numbness. Foot drop on the left side.    Plan: -Given his distribution of pain and weakness, suspect his symptoms are coming from the L4/5 stenosis seen on his MRI. I discussed his options including non-operative. He has tried multiple conservative treatments over the last year without any relief and his symptoms have gotten to the point that he is no longer able to work, so discussed surgery in the form of a L4/5 laminectomy. He wanted to talk about things further before making a decision -Patient will call me with how he would like to proceed   The patient has symptoms consistent with lumbar radiculopathy. He has developed a foot drop. The patient's symptoms were not getting improvement with conservative treatment so operative management was discussed in the form of L4/5 laminectomy. The risks including but not limited to iatrogenic instability, dural tear, nerve root injury, paralysis, persistent pain, infection, bleeding, heart attack, death, stroke, fracture, dvt/pe, and need for additional procedures were discussed with the patient. The benefit of the surgery would be improvement in the patient's radiating leg pain. I explained that back pain is not reliably alleviated with this surgery. I told him that his numbness may not recover given the duration of his symptoms. He is unclear about when the foot drop started so that may get better especially since he has 3/5 strength. I told him that both the numbness and weakness can take months to get better if they get better. The alternatives to surgical management were covered with the patient and included continued monitoring, physical therapy, over-the-counter pain medications, lyrica/gabapentin, ambulatory aids, repeat injections, and activity modification. All the patient's questions were answered to his  satisfaction. After this discussion, the patient expressed understanding and wanted to think about things more before making a decision.   ___________________________________________________________________________   History:  Patient is a 59 y.o. male who presents today for lumbar spine. Patient has had over 1 year of low back pain that radiates into the bilateral buttocks (L>R). He also has noticed left leg numbness over the lateral thigh and leg. He sometimes feel weakness in the left leg that causes him to nearly fall. 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 He notes his symptoms are better if he sits down or leans forward. There was no trauma or injury that preceded the onset of his symptoms.    Weakness: Yes, left leg feels weaker at times Symptoms of imbalance: Yes, when his left leg gives out, he loses his balance  Paresthesias and numbness: Yes, has numbness along the lateral aspect of his thigh and leg Bowel or bladder incontinence: denies Saddle anesthesia: denies   Treatments tried: tylenol , PT, meloxicam , prednisone , lumbar steroid injection  Review of systems: Denies fevers and chills, night sweats, unexplained weight loss, history of cancer. Has had pain that wakes him at night  Past medical history: HLD HTN Depression/anxiety DM  Allergies: NKDA  Past surgical history:  None  Social history: Denies use of nicotine  product (smoking, vaping, patches, smokeless) Alcohol use: denies Denies recreational drug use   Physical Exam:  BMI of 34.0  General: no acute distress, appears stated age Neurologic: alert, answering questions appropriately, following commands Respiratory: unlabored breathing on room air, symmetric chest rise Psychiatric: appropriate affect,  normal cadence to speech   MSK (spine):  -Strength exam      Left  Right EHL    4/5  5/5 TA    3/5  5/5 GSC     5/5  5/5 Knee extension  5/5  5/5 Hip flexion   5/5  5/5  -Sensory exam    Sensation intact to light touch in L3-S1 nerve distributions of bilateral lower extremities  -Achilles DTR: 2/4 on the left, 2/4 on the right -Patellar tendon DTR: 2/4 on the left, 2/4 on the right  -Straight leg raise: negative bilaterally -Clonus: no beats bilaterally  -Left hip exam: No pain through range of motion, negative Stinchfield, negative FABER -Right hip exam: No pain to range of motion, negative Stinchfield, negative FABER  Imaging: XRs of the lumbar spine from 08/17/2024 were independently reviewed and interpreted, showing no significant degenerative changes.  No evidence of instability on flexion/extension views.  No fracture or dislocation seen.  MRI of the lumbar spine from 06/18/2024 was independently reviewed and interpreted, showing central stenosis at L3/4. More significant central and lateral recess stenosis at L4/5. Mild foraminal stenosis at L5/S1.    Patient name: Bill Ferrell Patient MRN: [id number] Date of visit: 08/17/24

## 2024-08-25 ENCOUNTER — Telehealth: Payer: Self-pay | Admitting: Orthopedic Surgery

## 2024-08-25 NOTE — Telephone Encounter (Signed)
 Pt called saying that his disability paperwork hasn't been sent to [employer] and that they have been faxing us , but got no response after three times. Call back number is [phone].

## 2024-08-26 ENCOUNTER — Telehealth: Payer: Self-pay | Admitting: Orthopedic Surgery

## 2024-08-26 NOTE — Telephone Encounter (Signed)
 I called and lmom that I have looked everywhere and I cannot find the forms anywhere. I left the back up fax number [phone]) for him to try.

## 2024-08-26 NOTE — Telephone Encounter (Signed)
 I called and spoke with pt, he states that he needs the last note sent to [facility]. I faxed this to them per his request.
